# Patient Record
Sex: Female | Born: 1970 | Race: White | Hispanic: No | Marital: Married | Smoking: Never smoker
Health system: Southern US, Community
[De-identification: ages and names within clinical notes are randomized; demographics above are authoritative.]

## PROBLEM LIST (undated history)

## (undated) DIAGNOSIS — E079 Disorder of thyroid, unspecified: Secondary | ICD-10-CM

## (undated) DIAGNOSIS — I10 Essential (primary) hypertension: Secondary | ICD-10-CM

## (undated) DIAGNOSIS — N189 Chronic kidney disease, unspecified: Secondary | ICD-10-CM

## (undated) DIAGNOSIS — E785 Hyperlipidemia, unspecified: Secondary | ICD-10-CM

## (undated) HISTORY — DX: Hyperlipidemia, unspecified: E78.5

## (undated) HISTORY — DX: Essential (primary) hypertension: I10

## (undated) HISTORY — DX: Disorder of thyroid, unspecified: E07.9

## (undated) HISTORY — PX: NO PAST SURGERIES: SHX2092

## (undated) HISTORY — DX: Chronic kidney disease, unspecified: N18.9

---

## 2010-02-10 ENCOUNTER — Encounter: Admission: RE | Admit: 2010-02-10 | Discharge: 2010-02-10 | Payer: Self-pay | Admitting: Family Medicine

## 2011-06-27 DIAGNOSIS — E039 Hypothyroidism, unspecified: Secondary | ICD-10-CM | POA: Insufficient documentation

## 2011-08-08 DIAGNOSIS — F32A Depression, unspecified: Secondary | ICD-10-CM | POA: Insufficient documentation

## 2011-08-08 DIAGNOSIS — K900 Celiac disease: Secondary | ICD-10-CM | POA: Insufficient documentation

## 2011-08-13 ENCOUNTER — Other Ambulatory Visit: Payer: Self-pay | Admitting: Obstetrics and Gynecology

## 2011-08-13 DIAGNOSIS — Z1231 Encounter for screening mammogram for malignant neoplasm of breast: Secondary | ICD-10-CM

## 2011-08-20 ENCOUNTER — Ambulatory Visit
Admission: RE | Admit: 2011-08-20 | Discharge: 2011-08-20 | Disposition: A | Discharge status: Home / Self Care | Payer: Federal, State, Local not specified - PPO | Source: Ambulatory Visit | Attending: Obstetrics and Gynecology | Admitting: Obstetrics and Gynecology

## 2011-08-20 DIAGNOSIS — Z1231 Encounter for screening mammogram for malignant neoplasm of breast: Secondary | ICD-10-CM

## 2011-09-20 ENCOUNTER — Ambulatory Visit: Payer: Self-pay

## 2012-08-28 ENCOUNTER — Other Ambulatory Visit: Payer: Self-pay

## 2012-08-28 DIAGNOSIS — Z1231 Encounter for screening mammogram for malignant neoplasm of breast: Secondary | ICD-10-CM

## 2012-10-02 ENCOUNTER — Ambulatory Visit
Admission: RE | Admit: 2012-10-02 | Discharge: 2012-10-02 | Disposition: A | Discharge status: Home / Self Care | Payer: Federal, State, Local not specified - PPO | Source: Ambulatory Visit

## 2012-10-02 DIAGNOSIS — Z1231 Encounter for screening mammogram for malignant neoplasm of breast: Secondary | ICD-10-CM

## 2013-09-22 ENCOUNTER — Other Ambulatory Visit: Payer: Self-pay

## 2013-09-22 DIAGNOSIS — Z1231 Encounter for screening mammogram for malignant neoplasm of breast: Secondary | ICD-10-CM

## 2013-10-09 ENCOUNTER — Encounter (INDEPENDENT_AMBULATORY_CARE_PROVIDER_SITE_OTHER): Payer: Self-pay

## 2013-10-09 ENCOUNTER — Ambulatory Visit
Admission: RE | Admit: 2013-10-09 | Discharge: 2013-10-09 | Disposition: A | Discharge status: Home / Self Care | Payer: Federal, State, Local not specified - PPO | Source: Ambulatory Visit

## 2013-10-09 DIAGNOSIS — Z1231 Encounter for screening mammogram for malignant neoplasm of breast: Secondary | ICD-10-CM

## 2014-09-22 ENCOUNTER — Other Ambulatory Visit: Payer: Self-pay

## 2014-09-22 DIAGNOSIS — Z1231 Encounter for screening mammogram for malignant neoplasm of breast: Secondary | ICD-10-CM

## 2014-10-29 ENCOUNTER — Ambulatory Visit: Payer: Federal, State, Local not specified - PPO

## 2014-11-17 ENCOUNTER — Ambulatory Visit
Admission: RE | Admit: 2014-11-17 | Discharge: 2014-11-17 | Disposition: A | Discharge status: Home / Self Care | Payer: Federal, State, Local not specified - PPO | Source: Ambulatory Visit

## 2014-11-17 DIAGNOSIS — Z1231 Encounter for screening mammogram for malignant neoplasm of breast: Secondary | ICD-10-CM

## 2015-10-27 ENCOUNTER — Other Ambulatory Visit: Payer: Self-pay

## 2015-10-27 ENCOUNTER — Other Ambulatory Visit: Payer: Self-pay | Admitting: Obstetrics and Gynecology

## 2015-10-27 DIAGNOSIS — Z1231 Encounter for screening mammogram for malignant neoplasm of breast: Secondary | ICD-10-CM

## 2015-11-30 ENCOUNTER — Ambulatory Visit
Admission: RE | Admit: 2015-11-30 | Discharge: 2015-11-30 | Disposition: A | Discharge status: Home / Self Care | Payer: Federal, State, Local not specified - PPO | Source: Ambulatory Visit | Attending: Obstetrics and Gynecology | Admitting: Obstetrics and Gynecology

## 2015-11-30 DIAGNOSIS — Z1231 Encounter for screening mammogram for malignant neoplasm of breast: Secondary | ICD-10-CM

## 2016-10-30 ENCOUNTER — Other Ambulatory Visit: Payer: Self-pay | Admitting: Family Medicine

## 2016-10-30 DIAGNOSIS — Z1231 Encounter for screening mammogram for malignant neoplasm of breast: Secondary | ICD-10-CM

## 2016-12-21 ENCOUNTER — Ambulatory Visit: Payer: Federal, State, Local not specified - PPO

## 2017-01-07 ENCOUNTER — Ambulatory Visit: Payer: Federal, State, Local not specified - PPO

## 2017-04-30 ENCOUNTER — Other Ambulatory Visit: Payer: Self-pay | Admitting: Gastroenterology

## 2017-04-30 DIAGNOSIS — R1011 Right upper quadrant pain: Secondary | ICD-10-CM

## 2017-05-08 ENCOUNTER — Ambulatory Visit (HOSPITAL_COMMUNITY)
Admission: RE | Admit: 2017-05-08 | Discharge: 2017-05-08 | Disposition: A | Discharge status: Home / Self Care | Payer: Federal, State, Local not specified - PPO | Source: Ambulatory Visit | Attending: Gastroenterology | Admitting: Gastroenterology

## 2017-05-08 ENCOUNTER — Encounter (HOSPITAL_COMMUNITY)
Admission: RE | Admit: 2017-05-08 | Discharge: 2017-05-08 | Disposition: A | Discharge status: Home / Self Care | Payer: Federal, State, Local not specified - PPO | Source: Ambulatory Visit | Attending: Gastroenterology | Admitting: Gastroenterology

## 2017-05-08 DIAGNOSIS — R1011 Right upper quadrant pain: Secondary | ICD-10-CM

## 2017-05-08 MED ORDER — TECHNETIUM TC 99M MEBROFENIN IV KIT
5.1000 | PACK | Freq: Once | INTRAVENOUS | Status: AC | PRN
  Administered 2017-05-08: 5.1 via INTRAVENOUS

## 2017-05-16 DIAGNOSIS — Z9049 Acquired absence of other specified parts of digestive tract: Secondary | ICD-10-CM | POA: Insufficient documentation

## 2017-05-16 HISTORY — PX: CHOLECYSTECTOMY: SHX55

## 2017-06-25 ENCOUNTER — Ambulatory Visit
Admission: RE | Admit: 2017-06-25 | Discharge: 2017-06-25 | Disposition: A | Discharge status: Home / Self Care | Payer: Federal, State, Local not specified - PPO | Source: Ambulatory Visit | Attending: Family Medicine | Admitting: Family Medicine

## 2017-06-25 DIAGNOSIS — Z1231 Encounter for screening mammogram for malignant neoplasm of breast: Secondary | ICD-10-CM

## 2018-01-28 DIAGNOSIS — E663 Overweight: Secondary | ICD-10-CM | POA: Insufficient documentation

## 2018-01-28 DIAGNOSIS — K5909 Other constipation: Secondary | ICD-10-CM | POA: Insufficient documentation

## 2018-05-19 ENCOUNTER — Other Ambulatory Visit: Payer: Self-pay | Admitting: Family Medicine

## 2018-05-19 DIAGNOSIS — Z1231 Encounter for screening mammogram for malignant neoplasm of breast: Secondary | ICD-10-CM

## 2018-06-27 ENCOUNTER — Ambulatory Visit
Admission: RE | Admit: 2018-06-27 | Discharge: 2018-06-27 | Disposition: A | Discharge status: Home / Self Care | Payer: Federal, State, Local not specified - PPO | Source: Ambulatory Visit | Attending: Family Medicine | Admitting: Family Medicine

## 2018-06-27 DIAGNOSIS — Z1231 Encounter for screening mammogram for malignant neoplasm of breast: Secondary | ICD-10-CM

## 2018-12-09 DIAGNOSIS — E785 Hyperlipidemia, unspecified: Secondary | ICD-10-CM | POA: Insufficient documentation

## 2018-12-09 DIAGNOSIS — K219 Gastro-esophageal reflux disease without esophagitis: Secondary | ICD-10-CM | POA: Insufficient documentation

## 2018-12-09 DIAGNOSIS — I10 Essential (primary) hypertension: Secondary | ICD-10-CM | POA: Insufficient documentation

## 2019-03-03 DIAGNOSIS — M542 Cervicalgia: Secondary | ICD-10-CM | POA: Insufficient documentation

## 2019-03-03 DIAGNOSIS — R0602 Shortness of breath: Secondary | ICD-10-CM | POA: Insufficient documentation

## 2019-03-03 DIAGNOSIS — H9201 Otalgia, right ear: Secondary | ICD-10-CM | POA: Insufficient documentation

## 2019-03-03 DIAGNOSIS — K219 Gastro-esophageal reflux disease without esophagitis: Secondary | ICD-10-CM | POA: Insufficient documentation

## 2019-04-07 DIAGNOSIS — G9001 Carotid sinus syncope: Secondary | ICD-10-CM | POA: Insufficient documentation

## 2019-04-10 ENCOUNTER — Encounter: Payer: Self-pay | Admitting: Pulmonary Disease

## 2019-04-15 ENCOUNTER — Ambulatory Visit: Payer: Federal, State, Local not specified - PPO | Admitting: Pulmonary Disease

## 2019-04-15 ENCOUNTER — Encounter: Payer: Self-pay | Admitting: Pulmonary Disease

## 2019-04-15 ENCOUNTER — Other Ambulatory Visit: Payer: Self-pay

## 2019-04-15 VITALS — BP 128/88 | HR 85 | Temp 97.3°F | Ht 74.0 in | Wt 228.8 lb

## 2019-04-15 DIAGNOSIS — H5789 Other specified disorders of eye and adnexa: Secondary | ICD-10-CM

## 2019-04-15 DIAGNOSIS — K900 Celiac disease: Secondary | ICD-10-CM

## 2019-04-15 DIAGNOSIS — R1084 Generalized abdominal pain: Secondary | ICD-10-CM | POA: Diagnosis not present

## 2019-04-15 DIAGNOSIS — R0602 Shortness of breath: Secondary | ICD-10-CM

## 2019-04-15 NOTE — Progress Notes (Signed)
Synopsis: Referred in 04/15/2019 for SOB by [name], Michael C, MD  Subjective:   PATIENT ID: Diane Morales GENDER: female DOB: 11/20/1970, MRN: [id number]  Chief Complaint  Patient presents with  . Consult    Patient is having shortness of breath with exertion. Patient expresses this has got worse.    C/o SOB for >1 year. She was seen by PCP for abdominal pains, CT scan of abd was neg. She was referred to GI. Normal eval. Seen by PCP again sent to cardiology, stress ECHO was normal. Patient was concerned she had "wegners" and saw rheumatology and was told everything was normal. She had eye redness. And was started on NSAID. Sept/Aug had infections and saw Dr. Pollyann Kennedyosen for ear pain and drainage/pain. Ears were normal. SOB associated with posterior chest tightness. She does have cough. Productive in the morning . Thought to be reflux related.  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  Occupational exposures include chemical exposure however all of her mixes for chemotherapeutics are completed under a hood with appropriate PPE.  PMH HTN, hypothyroidism, GERD, microscopic hematuria, celiac disease    Past Medical History:  Diagnosis Date  . HTN (hypertension)      Family History  Problem Relation Age of Onset  . Breast cancer Mother [id number]  . Breast cancer Maternal Grandmother 72  . Heart disease Father      Past Surgical History:  Procedure Laterality Date  . NO PAST SURGERIES      Social History   Socioeconomic History  . Marital status: Married    Spouse name: Not on file  . Number of children: Not on file  . Years of education: Not on file  . Highest education level: Not on file  Occupational History  . Not on file  Social Needs  . Financial resource strain: Not on file  . Food insecurity    Worry: Not on file    Inability: Not on file  . Transportation needs    Medical: Not on file    Non-medical: Not on file  Tobacco Use  . Smoking  status: Never Smoker  . Smokeless tobacco: Never Used  Substance and Sexual Activity  . Alcohol use: Not on file  . Drug use: Not on file  . Sexual activity: Not on file  Lifestyle  . Physical activity    Days per week: Not on file    Minutes per session: Not on file  . Stress: Not on file  Relationships  . Social Musicianconnections    Talks on phone: Not on file    Gets together: Not on file    Attends religious service: Not on file    Active member of club or organization: Not on file    Attends meetings of clubs or organizations: Not on file    Relationship status: Not on file  . Intimate partner violence    Fear of current or ex partner: Not on file    Emotionally abused: Not on file    Physically abused: Not on file    Forced sexual activity: Not on file  Other Topics Concern  . Not on file  Social History Narrative  . Not on file     No Known Allergies   Outpatient Medications Prior to Visit  Medication Sig Dispense Refill  . folic acid (FOLVITE) 1 MG tablet folic acid 1 mg tablet    . levothyroxine (SYNTHROID) 175 MCG tablet levothyroxine 175 mcg tablet    .  lisinopril (ZESTRIL) 10 MG tablet lisinopril 10 mg tablet    . pantoprazole (PROTONIX) 20 MG tablet Take by mouth.    . Prucalopride Succinate (MOTEGRITY) 2 MG TABS Motegrity 2 mg tablet     No facility-administered medications prior to visit.     Review of Systems  Constitutional: Negative for chills, fever, malaise/fatigue and weight loss.  HENT: Negative for hearing loss, sore throat and tinnitus.   Eyes: Negative for blurred vision and double vision.  Respiratory: Positive for shortness of breath. Negative for cough, hemoptysis, sputum production, wheezing and stridor.   Cardiovascular: Negative for chest pain, palpitations, orthopnea, leg swelling and PND.  Gastrointestinal: Negative for abdominal pain, constipation, diarrhea, heartburn, nausea and vomiting.  Genitourinary: Negative for dysuria, hematuria  and urgency.  Musculoskeletal: Negative for joint pain and myalgias.  Skin: Negative for itching and rash.  Neurological: Negative for dizziness, tingling, weakness and headaches.  Endo/Heme/Allergies: Negative for environmental allergies. Does not bruise/bleed easily.  Psychiatric/Behavioral: Negative for depression. The patient is not nervous/anxious and does not have insomnia.   All other systems reviewed and are negative.    Objective:  Physical Exam Vitals signs reviewed.  Constitutional:      General: She is not in acute distress.    Appearance: She is well-developed.  HENT:     Head: Normocephalic and atraumatic.  Eyes:     General: No scleral icterus.    Conjunctiva/sclera: Conjunctivae normal.     Pupils: Pupils are equal, round, and reactive to light.  Neck:     Musculoskeletal: Neck supple.     Vascular: No JVD.     Trachea: No tracheal deviation.  Cardiovascular:     Rate and Rhythm: Normal rate and regular rhythm.     Heart sounds: Normal heart sounds. No murmur.  Pulmonary:     Effort: Pulmonary effort is normal. No tachypnea, accessory muscle usage or respiratory distress.     Breath sounds: Normal breath sounds. No stridor. No wheezing, rhonchi or rales.  Abdominal:     General: Bowel sounds are normal. There is no distension.     Palpations: Abdomen is soft.     Tenderness: There is no abdominal tenderness.  Musculoskeletal:        General: No tenderness.  Lymphadenopathy:     Cervical: No cervical adenopathy.  Skin:    General: Skin is warm and dry.     Capillary Refill: Capillary refill takes less than 2 seconds.     Findings: No rash.  Neurological:     Mental Status: She is alert and oriented to person, place, and time.  Psychiatric:        Behavior: Behavior normal.      Vitals:   04/15/19 0917  BP: 128/88  Pulse: 85  Temp: (!) 97.3 F (36.3 C)  TempSrc: Temporal  SpO2: 98%  Weight: 228 lb 12.8 oz (103.8 kg)  Height: 6\' 2"  (1.88 m)    98% on RA BMI Readings from Last 3 Encounters:  04/15/19 29.38 kg/m  05/08/17 25.68 kg/m   Wt Readings from Last 3 Encounters:  04/15/19 228 lb 12.8 oz (103.8 kg)  05/08/17 200 lb (90.7 kg)     CBC No results found for: WBC, RBC, HGB, HCT, PLT, MCV, MCH, MCHC, RDW, LYMPHSABS, MONOABS, EOSABS, BASOSABS   Chest Imaging: Chest x-ray 06/27/2018: Completed at [facility].  Image report in care everywhere states no acute process normal chest x-ray. The patient's images have been independently reviewed by me.  Pulmonary Functions Testing Results: No flowsheet data found.  FeNO: None   Pathology: None   Echocardiogram: None   Heart Catheterization: None     Assessment & Plan:     ICD-10-CM   1. SOB (shortness of breath)  R06.02 FULL + - Pulmonary Function Test (LBPU)  2. Shortness of breath  R06.02 HRCT ILD - CT CHEST HIGH RESOLUTION  3. Eye inflammation  H57.89   4. Abdominal pain, generalized  R10.84   5. Celiac disease  K90.0     Discussion:  This is a 48 year old female that has seen multiple specialists to include cardiology GI and her primary care provider for evaluation of various symptomatology.  At this point she is concerned about shortness of breath.  She has been told this may be related to increased weight gain.  She knows that she is not as athletic as she was in the past.  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  She states she has had cardiac evaluation which was negative.  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  She does have a varying list of complaints however have had rheumatologic work-up that was also negative.  She does have celiac disease.  Her shortness of breath is predominantly associated with exertion.  She has not had pulmonary function tests or any CT imaging of the chest.  Plan: We will obtain a high-resolution CT scan of the chest as well as pulmonary function test.  Pulmonary function test are likely going to be delayed due to Covid in her current scheduling process. We will call with CT scan results once completed. Due to the multiple symptomatology, history of celiac disease HRCT would be best to evaluate for any changes of interstitial lung disease.  Greater than 50% of this patient's 45-minute office visit was spent face-to-face discussing above recommendations and treatment plan.    Current Outpatient Medications:  .  folic acid (FOLVITE) 1 MG tablet, folic acid 1 mg tablet, Disp: , Rfl:  .  levothyroxine (SYNTHROID) 175 MCG tablet, levothyroxine 175 mcg tablet, Disp: , Rfl:  .  lisinopril (ZESTRIL) 10 MG tablet, lisinopril 10 mg tablet, Disp: , Rfl:  .  pantoprazole (PROTONIX) 20 MG tablet, Take by mouth., Disp: , Rfl:  .  Prucalopride Succinate (MOTEGRITY) 2 MG TABS, Motegrity 2 mg tablet, Disp: , Rfl:    Josephine Igo, DO [facility] 04/15/2019 11:16 AM

## 2019-04-15 NOTE — Patient Instructions (Addendum)
Thank you for visiting Dr. Valeta Harms at [facility]. Today we recommend the following:  Orders Placed This Encounter  Procedures  . HRCT ILD - CT CHEST HIGH RESOLUTION  . FULL + 6MWT - Pulmonary Function Test (LBPU)   I will call you with results of CT.  We will scheduled next available.   Return in about 8 weeks (around 06/10/2019).     Please do your part to reduce the spread of COVID-19.

## 2019-04-24 ENCOUNTER — Other Ambulatory Visit (HOSPITAL_COMMUNITY)
Admission: RE | Admit: 2019-04-24 | Discharge: 2019-04-24 | Disposition: A | Discharge status: Home / Self Care | Payer: Federal, State, Local not specified - PPO | Source: Ambulatory Visit | Attending: Pulmonary Disease | Admitting: Pulmonary Disease

## 2019-04-24 DIAGNOSIS — Z20828 Contact with and (suspected) exposure to other viral communicable diseases: Secondary | ICD-10-CM | POA: Insufficient documentation

## 2019-04-24 DIAGNOSIS — Z01812 Encounter for preprocedural laboratory examination: Secondary | ICD-10-CM | POA: Diagnosis present

## 2019-04-24 LAB — SARS CORONAVIRUS 2 (TAT 6-24 HRS): SARS Coronavirus 2: NEGATIVE

## 2019-04-28 ENCOUNTER — Other Ambulatory Visit: Payer: Self-pay

## 2019-04-28 ENCOUNTER — Ambulatory Visit (INDEPENDENT_AMBULATORY_CARE_PROVIDER_SITE_OTHER): Payer: Federal, State, Local not specified - PPO | Admitting: Pulmonary Disease

## 2019-04-28 ENCOUNTER — Other Ambulatory Visit: Payer: Federal, State, Local not specified - PPO

## 2019-04-28 DIAGNOSIS — R0602 Shortness of breath: Secondary | ICD-10-CM | POA: Diagnosis not present

## 2019-04-28 LAB — PULMONARY FUNCTION TEST
DL/VA % pred: 127 %
DL/VA: 5.2 ml/min/mmHg/L
DLCO unc % pred: 114 %
DLCO unc: 32.07 ml/min/mmHg
FEF 25-75 Post: 4.69 L/sec
FEF 25-75 Pre: 4.27 L/sec
FEF2575-%Change-Post: 9 %
FEF2575-%Pred-Post: 137 %
FEF2575-%Pred-Pre: 125 %
FEV1-%Change-Post: 0 %
FEV1-%Pred-Post: 97 %
FEV1-%Pred-Pre: 97 %
FEV1-Post: 3.66 L
FEV1-Pre: 3.63 L
FEV1FVC-%Change-Post: 2 %
FEV1FVC-%Pred-Pre: 104 %
FEV6-%Change-Post: -1 %
FEV6-%Pred-Post: 92 %
FEV6-%Pred-Pre: 93 %
FEV6-Post: 4.24 L
FEV6-Pre: 4.3 L
FEV6FVC-%Pred-Post: 102 %
FEV6FVC-%Pred-Pre: 102 %
FVC-%Change-Post: -1 %
FVC-%Pred-Post: 90 %
FVC-%Pred-Pre: 91 %
FVC-Post: 4.24 L
FVC-Pre: 4.3 L
Post FEV1/FVC ratio: 86 %
Post FEV6/FVC ratio: 100 %
Pre FEV1/FVC ratio: 84 %
Pre FEV6/FVC Ratio: 100 %
RV % pred: 92 %
RV: 1.99 L
TLC % pred: 101 %
TLC: 6.41 L

## 2019-04-28 NOTE — Progress Notes (Signed)
PFT completed today 04/28/19.  

## 2019-04-30 ENCOUNTER — Other Ambulatory Visit: Payer: Self-pay

## 2019-04-30 ENCOUNTER — Ambulatory Visit (INDEPENDENT_AMBULATORY_CARE_PROVIDER_SITE_OTHER)
Admission: RE | Admit: 2019-04-30 | Discharge: 2019-04-30 | Disposition: A | Discharge status: Home / Self Care | Payer: Federal, State, Local not specified - PPO | Source: Ambulatory Visit | Attending: Pulmonary Disease | Admitting: Pulmonary Disease

## 2019-04-30 DIAGNOSIS — R0602 Shortness of breath: Secondary | ICD-10-CM | POA: Diagnosis not present

## 2019-05-05 ENCOUNTER — Telehealth: Payer: Self-pay | Admitting: Pulmonary Disease

## 2019-05-05 NOTE — Telephone Encounter (Signed)
PCCM:  I attempted to call the patient at the number in the chart to discuss her recent ct results and PFTs.   There was no answer on the phone and her voicemail box was empty.   Garner Nash, DO [facility] 05/05/2019 3:03 PM

## 2019-06-10 ENCOUNTER — Other Ambulatory Visit: Payer: Self-pay

## 2019-06-10 ENCOUNTER — Encounter: Payer: Self-pay | Admitting: Primary Care

## 2019-06-10 ENCOUNTER — Ambulatory Visit (INDEPENDENT_AMBULATORY_CARE_PROVIDER_SITE_OTHER): Payer: Federal, State, Local not specified - PPO

## 2019-06-10 ENCOUNTER — Ambulatory Visit: Payer: Federal, State, Local not specified - PPO | Admitting: Primary Care

## 2019-06-10 DIAGNOSIS — J984 Other disorders of lung: Secondary | ICD-10-CM

## 2019-06-10 DIAGNOSIS — R0602 Shortness of breath: Secondary | ICD-10-CM

## 2019-06-10 MED ORDER — ALBUTEROL SULFATE HFA 108 (90 BASE) MCG/ACT IN AERS: 2.0000 | INHALATION_SPRAY | Freq: Four times a day (QID) | RESPIRATORY_TRACT | 1 refills | Status: DC | PRN

## 2019-06-10 NOTE — Progress Notes (Signed)
SIX MIN WALK 06/10/2019  Medications folic acid, protonix, synthroid, motegrity  Supplimental Oxygen during Test? (L/min) No  Laps 18  Partial Lap (in Meters) 16  Baseline BP (sitting) 124/90  Baseline Heartrate 66  Baseline Dyspnea (Borg Scale) 0  Baseline Fatigue (Borg Scale) 0  Baseline SPO2 100  BP (sitting) 132/90  Heartrate 83  Dyspnea (Borg Scale) 3  Fatigue (Borg Scale) 1  SPO2 99  BP (sitting) 128/88  Heartrate 73  SPO2 97  Stopped or Paused before Six Minutes No  Distance Completed 628  Tech Comments: pt only c/o chest tightness

## 2019-06-10 NOTE — Patient Instructions (Addendum)
Spirometry was normal Diffusion capacity was mildly elevated which can be due to asthma, obesity or cardiac   CT chest- no evidence of interstitial lung disease, mild air trapping predominantly in the upper lungs, indicative of mild small airway disease (these are the periphery airways that are <10mm and can be obstructed d/t inflammation)  Recommendations: Trial albuterol 2 puffs every 4-6 hours as needed for shortness of breath/wheezing or chest tightness  Follow-up: 6 months with Dr. Valeta Harms   Asthma, Adult  Asthma is a long-term (chronic) condition in which the airways get tight and narrow. The airways are the breathing passages that lead from the nose and mouth down into the lungs. A person with asthma will have times when symptoms get worse. These are called asthma attacks. They can cause coughing, whistling sounds when you breathe (wheezing), shortness of breath, and chest pain. They can make it hard to breathe. There is no cure for asthma, but medicines and lifestyle changes can help control it. There are many things that can bring on an asthma attack or make asthma symptoms worse (triggers). Common triggers include:  Mold.  Dust.  Cigarette smoke.  Cockroaches.  Things that can cause allergy symptoms (allergens). These include animal skin flakes (dander) and pollen from trees or grass.  Things that pollute the air. These may include household cleaners, wood smoke, smog, or chemical odors.  Cold air, weather changes, and wind.  Crying or laughing hard.  Stress.  Certain medicines or drugs.  Certain foods such as dried fruit, potato chips, and grape juice.  Infections, such as a cold or the flu.  Certain medical conditions or diseases.  Exercise or tiring activities. Asthma may be treated with medicines and by staying away from the things that cause asthma attacks. Types of medicines may include:  Controller medicines. These help prevent asthma symptoms. They are  usually taken every day.  Fast-acting reliever or rescue medicines. These quickly relieve asthma symptoms. They are used as needed and provide short-term relief.  Allergy medicines if your attacks are brought on by allergens.  Medicines to help control the body's defense (immune) system. Follow these instructions at home: Avoiding triggers in your home  Change your heating and air conditioning filter often.  Limit your use of fireplaces and wood stoves.  Get rid of pests (such as roaches and mice) and their droppings.  Throw away plants if you see mold on them.  Clean your floors. Dust regularly. Use cleaning products that do not smell.  Have someone vacuum when you are not home. Use a vacuum cleaner with a HEPA filter if possible.  Replace carpet with wood, tile, or vinyl flooring. Carpet can trap animal skin flakes and dust.  Use allergy-proof pillows, mattress covers, and box spring covers.  Wash bed sheets and blankets every week in hot water. Dry them in a dryer.  Keep your bedroom free of any triggers.  Avoid pets and keep windows closed when things that cause allergy symptoms are in the air.  Use blankets that are made of polyester or cotton.  Clean bathrooms and kitchens with bleach. If possible, have someone repaint the walls in these rooms with mold-resistant paint. Keep out of the rooms that are being cleaned and painted.  Wash your hands often with soap and water. If soap and water are not available, use hand sanitizer.  Do not allow anyone to smoke in your home. General instructions  Take over-the-counter and prescription medicines only as told by your doctor. ?  Talk with your doctor if you have questions about how or when to take your medicines. ? Make note if you need to use your medicines more often than usual.  Do not use any products that contain nicotine or tobacco, such as cigarettes and e-cigarettes. If you need help quitting, ask your doctor.  Stay  away from secondhand smoke.  Avoid doing things outdoors when allergen counts are high and when air quality is low.  Wear a ski mask when doing outdoor activities in the winter. The mask should cover your nose and mouth. Exercise indoors on cold days if you can.  Warm up before you exercise. Take time to cool down after exercise.  Use a peak flow meter as told by your doctor. A peak flow meter is a tool that measures how well the lungs are working.  Keep track of the peak flow meter's readings. Write them down.  Follow your asthma action plan. This is a written plan for taking care of your asthma and treating your attacks.  Make sure you get all the shots (vaccines) that your doctor recommends. Ask your doctor about a flu shot and a pneumonia shot.  Keep all follow-up visits as told by your doctor. This is important. Contact a doctor if:  You have wheezing, shortness of breath, or a cough even while taking medicine to prevent attacks.  The mucus you cough up (sputum) is thicker than usual.  The mucus you cough up changes from clear or white to yellow, green, gray, or bloody.  You have problems from the medicine you are taking, such as: ? A rash. ? Itching. ? Swelling. ? Trouble breathing.  You need reliever medicines more than 2-3 times a week.  Your peak flow reading is still at 50-79% of your personal best after following the action plan for 1 hour.  You have a fever. Get help right away if:  You seem to be worse and are not responding to medicine during an asthma attack.  You are short of breath even at rest.  You get short of breath when doing very little activity.  You have trouble eating, drinking, or talking.  You have chest pain or tightness.  You have a fast heartbeat.  Your lips or fingernails start to turn blue.  You are light-headed or dizzy, or you faint.  Your peak flow is less than 50% of your personal best.  You feel too tired to breathe  normally. Summary  Asthma is a long-term (chronic) condition in which the airways get tight and narrow. An asthma attack can make it hard to breathe.  Asthma cannot be cured, but medicines and lifestyle changes can help control it.  Make sure you understand how to avoid triggers and how and when to use your medicines. This information is not intended to replace advice given to you by your health care provider. Make sure you discuss any questions you have with your health care provider. Document Revised: 07/17/2018 Document Reviewed: 06/18/2016 Elsevier Patient Education  2020 [employer].

## 2019-06-10 NOTE — Assessment & Plan Note (Addendum)
-   No reports of shortness of breath or cough. Occasional "chest tightness" with activity. - Spirometry normal. Mildly elevated DLCO which can be seen in asthma or obesity - HRCT showed no evidence of ILD; mild air trapping predominantly in the upper lungs, indicative of mild small airway disease - Trial PRN albuterol 2 puffs every 4-6 hours for sob/wheezing  - Recommend weight loss and increasing physical activity - Continue to follow with cardiology  - FU in 6 months with Dr. Tonia Brooms

## 2019-06-10 NOTE — Progress Notes (Signed)
@Patient  ID: , female    DOB: 1971-04-09, 48 y.o.   MRN: [id number]  Chief Complaint  Patient presents with  . Follow-up    PFT 04/28/2019,6 min walk today.C/o heartburn occass.,no sob or cough.    Referring provider: 14/05/2018, MD  HPI: 49 year old female, never smoked. PMH significant for HTN, GERD, hypothyroidism, microscopic hematuria, celiac disease. Patient of Dr. 57, seen for new consult for shortness of breath on 04/15/19. She has had a cardiac evaluation which was negative. Ordered for HRCT and PFTs.   06/10/2019 Patient presents today for follow-up with 06/12/2019. She is doing well, denies shortness of breath or cough. Reports occasional chest pain "indigestion" which is  becoming more frequent. Described as a "jolt" that goes away and sometimes it lingers as chest tightness. At first she was experiencing shortness of breath in the morning, she thought this could be due to her ACEi. She now takes lisinopril at night. Reports that she has had a normal stress test with cardiology. She feels a lot of her symptoms can be attributed to weight gain and not being as active as she once was. She has received first COVID vaccine.   Chest Imaging:   HRCT 04/30/19- No evidence of ILD, mild air trapping predominantly in the upper lungs, indicative of mild small airway disease  Chest x-ray 06/27/2018: Completed at [facility].  Image report in care everywhere states no acute process normal chest x-ray.  Pulmonary Functions Testing Results: 04/28/19- FVC 4.24 (90%), FEV1 3.66 (97%), ratio 86 Normal spirometry, DLCO mildly elevated which can be seen in asthma, obesity or cardiac   FeNO: None    No Known Allergies  Immunization History  Administered Date(s) Administered  . Influenza-Unspecified 02/18/2019    Past Medical History:  Diagnosis Date  . HTN (hypertension)     Tobacco History: Social History   Tobacco Use  Smoking Status Never Smoker  Smokeless  Tobacco Never Used   Counseling given: Not Answered   Outpatient Medications Prior to Visit  Medication Sig Dispense Refill  . folic acid (FOLVITE) 1 MG tablet folic acid 1 mg tablet    . levothyroxine (SYNTHROID) 175 MCG tablet levothyroxine 175 mcg tablet    . lisinopril (ZESTRIL) 10 MG tablet lisinopril 10 mg tablet    . meloxicam (MOBIC) 15 MG tablet Take 15 mg by mouth daily.    . pantoprazole (PROTONIX) 20 MG tablet Take by mouth.    . Prucalopride Succinate (MOTEGRITY) 2 MG TABS Motegrity 2 mg tablet     No facility-administered medications prior to visit.   Review of Systems  Review of Systems  Respiratory: Positive for chest tightness. Negative for shortness of breath and wheezing.   Gastrointestinal:       Heartburn/indigestion    Physical Exam  BP 128/76 (BP Location: Right Arm, Cuff Size: Normal)   Pulse 70   Temp 97.7 F (36.5 C) (Temporal)   Ht 6\' 2"  (1.88 m)   Wt 228 lb (103.4 kg)   SpO2 99%   BMI 29.27 kg/m  Physical Exam Constitutional:      General: She is not in acute distress.    Appearance: Normal appearance. She is not ill-appearing.  HENT:     Head: Normocephalic and atraumatic.     Mouth/Throat:     Comments: Deferred d/t masking Cardiovascular:     Rate and Rhythm: Normal rate and regular rhythm.     Heart sounds: No murmur.  Pulmonary:  Effort: Pulmonary effort is normal. No respiratory distress.     Breath sounds: Normal breath sounds. No stridor. No wheezing.     Comments: Diminished at bases Musculoskeletal:     Cervical back: Normal range of motion and neck supple.  Skin:    General: Skin is warm and dry.  Neurological:     General: No focal deficit present.     Mental Status: She is alert and oriented to person, place, and time. Mental status is at baseline.  Psychiatric:        Mood and Affect: Mood normal.        Behavior: Behavior normal.        Thought Content: Thought content normal.        Judgment: Judgment normal.        Lab Results:  CBC No results found for: WBC, RBC, HGB, HCT, PLT, MCV, MCH, MCHC, RDW, LYMPHSABS, MONOABS, EOSABS, BASOSABS  BMET No results found for: NA, K, CL, CO2, GLUCOSE, BUN, CREATININE, CALCIUM, GFRNONAA, GFRAA  BNP No results found for: BNP  ProBNP No results found for: PROBNP  Imaging: No results found.   Assessment & Plan:   Small airways disease - No reports of shortness of breath or cough. Occasional "chest tightness" with activity. - Spirometry normal. Mildly elevated DLCO which can be seen in asthma or obesity - HRCT showed no evidence of ILD; mild air trapping predominantly in the upper lungs, indicative of mild small airway disease - Trial PRN albuterol 2 puffs every 4-6 hours for sob/wheezing  - Recommend weight loss and increasing physical activity - Continue to follow with cardiology  - FU in 6 months with Dr. Marthe Patch, NP 06/10/2019

## 2019-06-30 ENCOUNTER — Other Ambulatory Visit: Payer: Self-pay | Admitting: Family Medicine

## 2019-06-30 DIAGNOSIS — Z1231 Encounter for screening mammogram for malignant neoplasm of breast: Secondary | ICD-10-CM

## 2019-08-04 ENCOUNTER — Ambulatory Visit: Payer: Federal, State, Local not specified - PPO

## 2019-08-17 ENCOUNTER — Other Ambulatory Visit: Payer: Self-pay

## 2019-08-17 ENCOUNTER — Ambulatory Visit
Admission: RE | Admit: 2019-08-17 | Discharge: 2019-08-17 | Disposition: A | Discharge status: Home / Self Care | Payer: Federal, State, Local not specified - PPO | Source: Ambulatory Visit | Attending: Family Medicine | Admitting: Family Medicine

## 2019-08-17 DIAGNOSIS — Z1231 Encounter for screening mammogram for malignant neoplasm of breast: Secondary | ICD-10-CM

## 2020-04-12 DIAGNOSIS — K644 Residual hemorrhoidal skin tags: Secondary | ICD-10-CM | POA: Diagnosis not present

## 2020-04-12 DIAGNOSIS — K219 Gastro-esophageal reflux disease without esophagitis: Secondary | ICD-10-CM | POA: Diagnosis not present

## 2020-04-12 DIAGNOSIS — R635 Abnormal weight gain: Secondary | ICD-10-CM | POA: Diagnosis not present

## 2020-04-12 DIAGNOSIS — K5904 Chronic idiopathic constipation: Secondary | ICD-10-CM | POA: Diagnosis not present

## 2020-04-20 DIAGNOSIS — E039 Hypothyroidism, unspecified: Secondary | ICD-10-CM | POA: Diagnosis not present

## 2020-04-20 DIAGNOSIS — E785 Hyperlipidemia, unspecified: Secondary | ICD-10-CM | POA: Diagnosis not present

## 2020-05-16 DIAGNOSIS — I10 Essential (primary) hypertension: Secondary | ICD-10-CM | POA: Diagnosis not present

## 2020-05-16 DIAGNOSIS — E039 Hypothyroidism, unspecified: Secondary | ICD-10-CM | POA: Diagnosis not present

## 2020-06-16 DIAGNOSIS — I10 Essential (primary) hypertension: Secondary | ICD-10-CM | POA: Diagnosis not present

## 2020-06-21 DIAGNOSIS — Z1152 Encounter for screening for COVID-19: Secondary | ICD-10-CM | POA: Diagnosis not present

## 2020-06-28 DIAGNOSIS — L814 Other melanin hyperpigmentation: Secondary | ICD-10-CM | POA: Diagnosis not present

## 2020-06-28 DIAGNOSIS — L578 Other skin changes due to chronic exposure to nonionizing radiation: Secondary | ICD-10-CM | POA: Diagnosis not present

## 2020-06-28 DIAGNOSIS — L719 Rosacea, unspecified: Secondary | ICD-10-CM | POA: Diagnosis not present

## 2020-06-28 DIAGNOSIS — L821 Other seborrheic keratosis: Secondary | ICD-10-CM | POA: Diagnosis not present

## 2020-08-09 DIAGNOSIS — K219 Gastro-esophageal reflux disease without esophagitis: Secondary | ICD-10-CM | POA: Diagnosis not present

## 2020-08-09 DIAGNOSIS — K5904 Chronic idiopathic constipation: Secondary | ICD-10-CM | POA: Diagnosis not present

## 2020-08-09 DIAGNOSIS — K900 Celiac disease: Secondary | ICD-10-CM | POA: Diagnosis not present

## 2020-08-09 DIAGNOSIS — Z1211 Encounter for screening for malignant neoplasm of colon: Secondary | ICD-10-CM | POA: Diagnosis not present

## 2020-08-16 DIAGNOSIS — M722 Plantar fascial fibromatosis: Secondary | ICD-10-CM | POA: Diagnosis not present

## 2020-08-16 DIAGNOSIS — H15102 Unspecified episcleritis, left eye: Secondary | ICD-10-CM | POA: Diagnosis not present

## 2020-08-16 DIAGNOSIS — R0602 Shortness of breath: Secondary | ICD-10-CM | POA: Diagnosis not present

## 2020-08-16 DIAGNOSIS — R079 Chest pain, unspecified: Secondary | ICD-10-CM | POA: Diagnosis not present

## 2020-08-16 DIAGNOSIS — M940 Chondrocostal junction syndrome [Tietze]: Secondary | ICD-10-CM | POA: Diagnosis not present

## 2020-08-16 DIAGNOSIS — R1011 Right upper quadrant pain: Secondary | ICD-10-CM | POA: Diagnosis not present

## 2020-08-16 DIAGNOSIS — R5383 Other fatigue: Secondary | ICD-10-CM | POA: Diagnosis not present

## 2020-08-17 ENCOUNTER — Other Ambulatory Visit: Payer: Self-pay | Admitting: Family Medicine

## 2020-08-17 DIAGNOSIS — Z1231 Encounter for screening mammogram for malignant neoplasm of breast: Secondary | ICD-10-CM

## 2020-10-13 ENCOUNTER — Other Ambulatory Visit: Payer: Self-pay

## 2020-10-13 ENCOUNTER — Ambulatory Visit
Admission: RE | Admit: 2020-10-13 | Discharge: 2020-10-13 | Disposition: A | Discharge status: Home / Self Care | Payer: Federal, State, Local not specified - PPO | Source: Ambulatory Visit | Attending: Family Medicine | Admitting: Family Medicine

## 2020-10-13 ENCOUNTER — Ambulatory Visit: Payer: Federal, State, Local not specified - PPO

## 2020-10-13 DIAGNOSIS — Z1231 Encounter for screening mammogram for malignant neoplasm of breast: Secondary | ICD-10-CM | POA: Diagnosis not present

## 2020-10-13 DIAGNOSIS — Z01419 Encounter for gynecological examination (general) (routine) without abnormal findings: Secondary | ICD-10-CM | POA: Diagnosis not present

## 2020-10-19 DIAGNOSIS — R079 Chest pain, unspecified: Secondary | ICD-10-CM | POA: Diagnosis not present

## 2020-10-19 DIAGNOSIS — M722 Plantar fascial fibromatosis: Secondary | ICD-10-CM | POA: Diagnosis not present

## 2020-10-19 DIAGNOSIS — M940 Chondrocostal junction syndrome [Tietze]: Secondary | ICD-10-CM | POA: Diagnosis not present

## 2020-10-27 DIAGNOSIS — R2689 Other abnormalities of gait and mobility: Secondary | ICD-10-CM | POA: Diagnosis not present

## 2020-10-27 DIAGNOSIS — M25571 Pain in right ankle and joints of right foot: Secondary | ICD-10-CM | POA: Diagnosis not present

## 2020-10-31 DIAGNOSIS — M25571 Pain in right ankle and joints of right foot: Secondary | ICD-10-CM | POA: Diagnosis not present

## 2020-10-31 DIAGNOSIS — R2689 Other abnormalities of gait and mobility: Secondary | ICD-10-CM | POA: Diagnosis not present

## 2020-11-03 DIAGNOSIS — N939 Abnormal uterine and vaginal bleeding, unspecified: Secondary | ICD-10-CM | POA: Diagnosis not present

## 2020-11-07 DIAGNOSIS — M25571 Pain in right ankle and joints of right foot: Secondary | ICD-10-CM | POA: Diagnosis not present

## 2020-11-07 DIAGNOSIS — R2689 Other abnormalities of gait and mobility: Secondary | ICD-10-CM | POA: Diagnosis not present

## 2020-11-10 DIAGNOSIS — R2689 Other abnormalities of gait and mobility: Secondary | ICD-10-CM | POA: Diagnosis not present

## 2020-11-10 DIAGNOSIS — M25571 Pain in right ankle and joints of right foot: Secondary | ICD-10-CM | POA: Diagnosis not present

## 2020-11-11 DIAGNOSIS — H0102B Squamous blepharitis left eye, upper and lower eyelids: Secondary | ICD-10-CM | POA: Diagnosis not present

## 2020-11-11 DIAGNOSIS — H15113 Episcleritis periodica fugax, bilateral: Secondary | ICD-10-CM | POA: Diagnosis not present

## 2020-11-11 DIAGNOSIS — H0102A Squamous blepharitis right eye, upper and lower eyelids: Secondary | ICD-10-CM | POA: Diagnosis not present

## 2020-11-16 DIAGNOSIS — M25571 Pain in right ankle and joints of right foot: Secondary | ICD-10-CM | POA: Diagnosis not present

## 2020-11-16 DIAGNOSIS — R2689 Other abnormalities of gait and mobility: Secondary | ICD-10-CM | POA: Diagnosis not present

## 2020-11-21 DIAGNOSIS — R2689 Other abnormalities of gait and mobility: Secondary | ICD-10-CM | POA: Diagnosis not present

## 2020-11-21 DIAGNOSIS — M25571 Pain in right ankle and joints of right foot: Secondary | ICD-10-CM | POA: Diagnosis not present

## 2020-11-24 DIAGNOSIS — M25571 Pain in right ankle and joints of right foot: Secondary | ICD-10-CM | POA: Diagnosis not present

## 2020-11-24 DIAGNOSIS — R2689 Other abnormalities of gait and mobility: Secondary | ICD-10-CM | POA: Diagnosis not present

## 2020-12-12 DIAGNOSIS — R2689 Other abnormalities of gait and mobility: Secondary | ICD-10-CM | POA: Diagnosis not present

## 2020-12-12 DIAGNOSIS — M25571 Pain in right ankle and joints of right foot: Secondary | ICD-10-CM | POA: Diagnosis not present

## 2020-12-26 DIAGNOSIS — R2689 Other abnormalities of gait and mobility: Secondary | ICD-10-CM | POA: Diagnosis not present

## 2020-12-26 DIAGNOSIS — M25571 Pain in right ankle and joints of right foot: Secondary | ICD-10-CM | POA: Diagnosis not present

## 2020-12-27 DIAGNOSIS — R079 Chest pain, unspecified: Secondary | ICD-10-CM | POA: Diagnosis not present

## 2020-12-27 DIAGNOSIS — M940 Chondrocostal junction syndrome [Tietze]: Secondary | ICD-10-CM | POA: Diagnosis not present

## 2020-12-27 DIAGNOSIS — H15102 Unspecified episcleritis, left eye: Secondary | ICD-10-CM | POA: Diagnosis not present

## 2020-12-27 DIAGNOSIS — R1013 Epigastric pain: Secondary | ICD-10-CM | POA: Diagnosis not present

## 2020-12-28 DIAGNOSIS — Z0000 Encounter for general adult medical examination without abnormal findings: Secondary | ICD-10-CM | POA: Diagnosis not present

## 2020-12-28 DIAGNOSIS — I10 Essential (primary) hypertension: Secondary | ICD-10-CM | POA: Diagnosis not present

## 2020-12-28 DIAGNOSIS — E039 Hypothyroidism, unspecified: Secondary | ICD-10-CM | POA: Diagnosis not present

## 2020-12-28 DIAGNOSIS — E785 Hyperlipidemia, unspecified: Secondary | ICD-10-CM | POA: Diagnosis not present

## 2021-01-02 ENCOUNTER — Emergency Department (HOSPITAL_COMMUNITY)
Admission: EM | Admit: 2021-01-02 | Discharge: 2021-01-02 | Disposition: A | Discharge status: Home / Self Care | Payer: Federal, State, Local not specified - PPO | Attending: Student | Admitting: Student

## 2021-01-02 ENCOUNTER — Emergency Department (HOSPITAL_COMMUNITY): Payer: Federal, State, Local not specified - PPO

## 2021-01-02 ENCOUNTER — Other Ambulatory Visit: Payer: Self-pay

## 2021-01-02 DIAGNOSIS — R079 Chest pain, unspecified: Secondary | ICD-10-CM | POA: Diagnosis not present

## 2021-01-02 DIAGNOSIS — Z79899 Other long term (current) drug therapy: Secondary | ICD-10-CM | POA: Diagnosis not present

## 2021-01-02 DIAGNOSIS — R0602 Shortness of breath: Secondary | ICD-10-CM | POA: Diagnosis not present

## 2021-01-02 DIAGNOSIS — R0600 Dyspnea, unspecified: Secondary | ICD-10-CM | POA: Diagnosis not present

## 2021-01-02 DIAGNOSIS — I10 Essential (primary) hypertension: Secondary | ICD-10-CM | POA: Insufficient documentation

## 2021-01-02 DIAGNOSIS — L719 Rosacea, unspecified: Secondary | ICD-10-CM | POA: Insufficient documentation

## 2021-01-02 DIAGNOSIS — R0902 Hypoxemia: Secondary | ICD-10-CM | POA: Diagnosis not present

## 2021-01-02 DIAGNOSIS — R0789 Other chest pain: Secondary | ICD-10-CM | POA: Diagnosis not present

## 2021-01-02 DIAGNOSIS — R0781 Pleurodynia: Secondary | ICD-10-CM | POA: Diagnosis not present

## 2021-01-02 DIAGNOSIS — J9811 Atelectasis: Secondary | ICD-10-CM | POA: Diagnosis not present

## 2021-01-02 DIAGNOSIS — M940 Chondrocostal junction syndrome [Tietze]: Secondary | ICD-10-CM

## 2021-01-02 LAB — T4, FREE: Free T4: 1.04 ng/dL (ref 0.61–1.12)

## 2021-01-02 LAB — CBC
HCT: 37 % (ref 36.0–46.0)
Hemoglobin: 12.3 g/dL (ref 12.0–15.0)
MCH: 27.7 pg (ref 26.0–34.0)
MCHC: 33.2 g/dL (ref 30.0–36.0)
MCV: 83.3 fL (ref 80.0–100.0)
Platelets: 387 10*3/uL (ref 150–400)
RBC: 4.44 MIL/uL (ref 3.87–5.11)
RDW: 14.6 % (ref 11.5–15.5)
WBC: 12.3 10*3/uL — ABNORMAL HIGH (ref 4.0–10.5)
nRBC: 0 % (ref 0.0–0.2)

## 2021-01-02 LAB — BASIC METABOLIC PANEL
Anion gap: 9 (ref 5–15)
BUN: 29 mg/dL — ABNORMAL HIGH (ref 6–20)
CO2: 26 mmol/L (ref 22–32)
Calcium: 9 mg/dL (ref 8.9–10.3)
Chloride: 100 mmol/L (ref 98–111)
Creatinine, Ser: 1.4 mg/dL — ABNORMAL HIGH (ref 0.44–1.00)
GFR, Estimated: 46 mL/min — ABNORMAL LOW (ref >60)
Glucose, Bld: 130 mg/dL — ABNORMAL HIGH (ref 70–99)
Potassium: 3.3 mmol/L — ABNORMAL LOW (ref 3.5–5.1)
Sodium: 135 mmol/L (ref 135–145)

## 2021-01-02 LAB — TSH: TSH: 45.647 u[IU]/mL — ABNORMAL HIGH (ref 0.350–4.500)

## 2021-01-02 LAB — D-DIMER, QUANTITATIVE: D-Dimer, Quant: 2.02 ug/mL-FEU — ABNORMAL HIGH (ref 0.00–0.50)

## 2021-01-02 LAB — TROPONIN I (HIGH SENSITIVITY)
Troponin I (High Sensitivity): 4 ng/L (ref <18)
Troponin I (High Sensitivity): 4 ng/L (ref <18)

## 2021-01-02 MED ORDER — LACTATED RINGERS IV BOLUS
1000.0000 mL | Freq: Once | INTRAVENOUS | Status: AC
  Administered 2021-01-02: 1000 mL via INTRAVENOUS

## 2021-01-02 MED ORDER — LIDOCAINE 5 % EX PTCH
1.0000 | MEDICATED_PATCH | CUTANEOUS | Status: DC
  Administered 2021-01-02: 1 via TRANSDERMAL
  Filled 2021-01-02: qty 1

## 2021-01-02 MED ORDER — IOHEXOL 350 MG/ML SOLN
100.0000 mL | Freq: Once | INTRAVENOUS | Status: AC | PRN
  Administered 2021-01-02: 100 mL via INTRAVENOUS

## 2021-01-02 MED ORDER — POTASSIUM CHLORIDE CRYS ER 20 MEQ PO TBCR
20.0000 meq | EXTENDED_RELEASE_TABLET | Freq: Once | ORAL | Status: AC
  Administered 2021-01-02: 20 meq via ORAL
  Filled 2021-01-02: qty 1

## 2021-01-02 NOTE — ED Notes (Signed)
Pt to ct 

## 2021-01-02 NOTE — ED Triage Notes (Signed)
Pt BIB EMS from home. Per EMS pt woke up with severe CP radiating to her stomach- Pt reports that she is unable to take a deep breathe without having a lot of pain. Pt started a new medication to help with  her cycle, loloestrin, and a side effect are clots. Initial O2 on RA was 89% - started on 10 L and O2 improved to 97% BP initial 210/92 - EMS gave 2 nitro and 6 of morphine - last BP 160/90 22 LAC

## 2021-01-02 NOTE — ED Provider Notes (Signed)
[facility] EMERGENCY DEPARTMENT Provider Note   CSN: [id number] Arrival date & time: 01/02/21  0221     History Chief Complaint  Patient presents with   Chest Pain   Shortness of Breath    Diane Morales is a 50 y.o. female with PMH HTN, abnormal uterine bleeding currently on OCPs who presents to the emergency department for evaluation of pleuritic chest pain and shortness of breath.  Patient states that she awoke at approximately 1 AM on 01/02/2021 with severe onset left-sided chest pain worse with deep breaths under the left breast.  EMS found the patient to be 89% on room air and was placed on 10 L and brought to the emergency department.  Patient was given 2 nitro and 6 mg of morphine by EMS.  On initial evaluation in the emergency department, patient is saturating 95% on room air but has persistent pleuritic chest pain.  Denies nausea, vomiting, abdominal pain, headache, fever or other systemic symptoms.   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     Family History  Problem Relation Age of Onset   Breast cancer Mother 52   Breast cancer Maternal Grandmother 74   Heart disease Father     Social History   Tobacco Use   Smoking status: Never   Smokeless tobacco: Never    Home Medications Prior to Admission medications   Medication Sig Start Date End Date Taking? Authorizing  Provider  albuterol (VENTOLIN HFA) 108 (90 Base) MCG/ACT inhaler Inhale 2 puffs into the lungs every 6 (six) hours as needed for wheezing or shortness of breath. 06/10/19   Glenford Bayley, NP  folic acid (FOLVITE) 1 MG tablet folic acid 1 mg tablet 02/27/19   [provider]  levothyroxine (SYNTHROID) 175 MCG tablet levothyroxine 175 mcg tablet 12/15/18   [provider]  lisinopril (ZESTRIL) 10 MG tablet lisinopril 10 mg tablet 02/04/19   [provider]  meloxicam (MOBIC) 15 MG tablet Take 15 mg by mouth daily.    [provider]  pantoprazole (PROTONIX) 20 MG tablet Take by mouth.    [provider]  Prucalopride Succinate (MOTEGRITY) 2 MG TABS Motegrity 2 mg tablet 05/01/18   [provider]    Allergies    Patient has no known allergies.  Review of Systems   Review of Systems  Constitutional:  Negative for chills and fever.  HENT:  Negative for ear pain and sore throat.   Eyes:  Negative for pain and visual disturbance.  Respiratory:  Positive for shortness of breath. Negative for cough.   Cardiovascular:  Positive for chest pain. Negative for palpitations.  Gastrointestinal:  Negative for abdominal pain and vomiting.  Genitourinary:  Negative for dysuria and hematuria.  Musculoskeletal:  Negative for arthralgias and back pain.  Skin:  Negative for  color change and rash.  Neurological:  Negative for seizures and syncope.  All other systems reviewed and are negative.  Physical Exam Updated Vital Signs BP (!) 159/86   Pulse 83   Temp 99.7 F (37.6 C) (Axillary)   Resp 18   Ht 6\' 2"  (1.88 m)   Wt 112.5 kg   SpO2 95%   BMI 31.84 kg/m   Physical Exam Vitals and nursing note reviewed.  Constitutional:      General: She is not in acute distress.    Appearance: She is well-developed.  HENT:     Head: Normocephalic and atraumatic.  Eyes:     Conjunctiva/sclera: Conjunctivae normal.  Cardiovascular:     Rate and Rhythm:  Normal rate and regular rhythm.     Heart sounds: No murmur heard. Pulmonary:     Effort: Pulmonary effort is normal. No respiratory distress.     Breath sounds: Normal breath sounds.  Abdominal:     Palpations: Abdomen is soft.     Tenderness: There is no abdominal tenderness.  Musculoskeletal:     Cervical back: Neck supple.  Skin:    General: Skin is warm and dry.  Neurological:     Mental Status: She is alert.    ED Results / Procedures / Treatments   Labs (all labs ordered are listed, but only abnormal results are displayed) Labs Reviewed  BASIC METABOLIC PANEL - Abnormal; Notable for the following components:      Result Value   Potassium 3.3 (*)    Glucose, Bld 130 (*)    BUN 29 (*)    Creatinine, Ser 1.40 (*)    GFR, Estimated 46 (*)    All other components within normal limits  CBC - Abnormal; Notable for the following components:   WBC 12.3 (*)    All other components within normal limits  D-DIMER, QUANTITATIVE - Abnormal; Notable for the following components:   D-Dimer, Quant 2.02 (*)    All other components within normal limits  TSH  T4, FREE  TROPONIN I (HIGH SENSITIVITY)  TROPONIN I (HIGH SENSITIVITY)    EKG None  Radiology DG Chest Port 1 View  Result Date: 01/02/2021 CLINICAL DATA:  Dyspnea EXAM: PORTABLE CHEST 1 VIEW COMPARISON:  CT 04/30/2019 FINDINGS: The heart size and mediastinal contours are within normal limits. Both lungs are clear. The visualized skeletal structures are unremarkable. IMPRESSION: No active disease. Electronically Signed   By: 14/07/2018 M.D.   On: 01/02/2021 03:06    Procedures Procedures   Medications Ordered in ED Medications  lidocaine (LIDODERM) 5 % 1 patch (1 patch Transdermal Patch Applied 01/02/21 0418)  lactated ringers bolus 1,000 mL (1,000 mLs Intravenous New Bag/Given 01/02/21 0545)    ED Course  I have reviewed the triage vital signs and the nursing notes.  Pertinent labs & imaging results that were  available during my care of the patient were reviewed by me and considered in my medical decision making (see chart for details).    MDM Rules/Calculators/A&P                           Patient seen in the emergency department for evaluation of chest pain and shortness of breath.  Physical exam reveals a malar rash to the face that the patient states is consistent with her rosacea, cardiopulmonary exam otherwise unremarkable.  Abdominal exam unremarkable.  Laboratory evaluation reveals a mild leukocytosis to 12.3, hypokalemia to 3.3,  BUN 29, creatinine 1.4.  D-dimer elevated to 2.02.  Chest x-ray unremarkable.  CT PE is currently pending.  Patient requesting TSH and free T4 as she recently doubled her Synthroid dosing at the request of her primary care physician.  Patient then signed out to oncoming provider.  Please see provider signout note for continuation of work-up. Final Clinical Impression(s) / ED Diagnoses Final diagnoses:  None    Rx / DC Orders ED Discharge Orders     None        Mazzy Santarelli, Wyn Forster, MD 01/02/21 [phone]

## 2021-01-02 NOTE — ED Notes (Signed)
Unable to obtain appropriate IV for CT angio; IV team consult placed

## 2021-01-02 NOTE — ED Provider Notes (Signed)
Physical Exam  BP (!) 162/88   Pulse 82   Temp 99.7 F (37.6 C) (Axillary)   Resp 18   Ht 6\' 2"  (1.88 m)   Wt 112.5 kg   SpO2 94%   BMI 31.84 kg/m   Physical Exam Vitals and nursing note reviewed.  Constitutional:      Appearance: She is well-developed.    HENT:     Head: Normocephalic and atraumatic.  Cardiovascular:     Rate and Rhythm: Normal rate and regular rhythm.     Heart sounds: Normal heart sounds.  Pulmonary:     Effort: Pulmonary effort is normal.     Breath sounds: Normal breath sounds. No decreased breath sounds, wheezing, rhonchi or rales.  Musculoskeletal:     Right lower leg: No edema.     Left lower leg: No edema.  Skin:    Capillary Refill: Capillary refill takes less than 2 seconds.  Neurological:     General: No focal deficit present.     Mental Status: She is alert.  Psychiatric:        Mood and Affect: Mood normal.        Behavior: Behavior normal.    ED Course/Procedures     Procedures  Results for orders placed or performed during the hospital encounter of 01/02/21  Basic metabolic panel  Result Value Ref Range   Sodium 135 135 - 145 mmol/L   Potassium 3.3 (L) 3.5 - 5.1 mmol/L   Chloride 100 98 - 111 mmol/L   CO2 26 22 - 32 mmol/L   Glucose, Bld 130 (H) 70 - 99 mg/dL   BUN 29 (H) 6 - 20 mg/dL   Creatinine, Ser 03/04/21 (H) 0.44 - 1.00 mg/dL   Calcium 9.0 8.9 - 6.23 mg/dL   GFR, Estimated 46 (L) >60 mL/min   Anion gap 9 5 - 15  CBC  Result Value Ref Range   WBC 12.3 (H) 4.0 - 10.5 K/uL   RBC 4.44 3.87 - 5.11 MIL/uL   Hemoglobin 12.3 12.0 - 15.0 g/dL   HCT 76.2 83.1 - 51.7 %   MCV 83.3 80.0 - 100.0 fL   MCH 27.7 26.0 - 34.0 pg   MCHC 33.2 30.0 - 36.0 g/dL   RDW 61.6 07.3 - 71.0 %   Platelets 387 150 - 400 K/uL   nRBC 0.0 0.0 - 0.2 %  D-dimer, quantitative  Result Value Ref Range   D-Dimer, Quant 2.02 (H) 0.00 - 0.50 ug/mL-FEU  TSH  Result Value Ref Range   TSH 45.647 (H) 0.350 - 4.500 uIU/mL  T4, free  Result Value Ref  Range   Free T4 1.04 0.61 - 1.12 ng/dL  Troponin I (High Sensitivity)  Result Value Ref Range   Troponin I (High Sensitivity) 4 <18 ng/L  Troponin I (High Sensitivity)  Result Value Ref Range   Troponin I (High Sensitivity) 4 <18 ng/L   CT Angio Chest PE W and/or Wo Contrast  Result Date: 01/02/2021 CLINICAL DATA:  Pleuritic chest pain.  Elevated D-dimer level. EXAM: CT ANGIOGRAPHY CHEST WITH CONTRAST TECHNIQUE: Multidetector CT imaging of the chest was performed using the standard protocol during bolus administration of intravenous contrast. Multiplanar CT image reconstructions and MIPs were obtained to evaluate the vascular anatomy. CONTRAST:  03/04/2021 OMNIPAQUE IOHEXOL 350 MG/ML SOLN COMPARISON:  Chest CT 04/30/2019 FINDINGS: Cardiovascular: Accounting for motion artifact, no filling defect is identified in the pulmonary arterial tree to suggest pulmonary embolus. No acute vascular findings. Mediastinum/Nodes:  Unremarkable Lungs/Pleura: Symmetric dependent atelectasis in both lower lobes. Mosaic attenuation is noted at the lung apices with some reduced conspicuity of vascular structures in the darker segments suggesting air trapping/small airways disease. Upper Abdomen: Cholecystectomy. Musculoskeletal: Unremarkable Review of the MIP images confirms the above findings. IMPRESSION: 1. No filling defect is identified in the pulmonary arterial tree to suggest pulmonary embolus. 2. Symmetric dependent atelectasis in both lower lobes the. 3. Air trapping in the lung apices suggesting small airways disease/bronchiolitis. Electronically Signed   By: Gaylyn Rong M.D.   On: 01/02/2021 07:00   DG Chest Port 1 View  Result Date: 01/02/2021 CLINICAL DATA:  Dyspnea EXAM: PORTABLE CHEST 1 VIEW COMPARISON:  CT 04/30/2019 FINDINGS: The heart size and mediastinal contours are within normal limits. Both lungs are clear. The visualized skeletal structures are unremarkable. IMPRESSION: No active disease.  Electronically Signed   By: Jasmine Pang M.D.   On: 01/02/2021 03:06       MDM   6:15 AM Patient signed out to me by oncoming physician. 50-year-old female presenting for complaints of acute onset shortness of breath and chest pain that awoke her from breathing.  EKG and troponins-doubt ACS. Stable chest x-ray.  No pneumothorax.  No pneumonia.  Potassium minimally low.  Imdur given.  CT PE demonstrates no pulmonary embolism mild atelectasis.  Ambulatory pulse ox stable.  Heart score 1-low risk. Patient admits to working in garage all day yesterday. Pleuritic chest pain possibly secondary to costochondritis. Lidoderm pain patch already given. Motrin/tylenol recommended for home use.  With shared patient decision making, recommendations for discharge at this time with return precautions if symptoms worsen in any way.  Patient's husband at bedside states he will be with her over the next 24 hours and will keep a close eye on her.  Recommendations to follow-up with cardiology in the next 1 to 2 weeks if symptoms do not resolve for stress test. All questions answered prior to discharged.        Edwin Dada P, DO 01/02/21 [phone]

## 2021-01-02 NOTE — ED Notes (Signed)
Pt has 22 in LAC from EMS. This RN and [place] RN attempted to obtain IV access for CTA - unsuccessful. IV team consult to be put in

## 2021-01-04 DIAGNOSIS — R2689 Other abnormalities of gait and mobility: Secondary | ICD-10-CM | POA: Diagnosis not present

## 2021-01-04 DIAGNOSIS — M25571 Pain in right ankle and joints of right foot: Secondary | ICD-10-CM | POA: Diagnosis not present

## 2021-01-13 DIAGNOSIS — Z1211 Encounter for screening for malignant neoplasm of colon: Secondary | ICD-10-CM | POA: Diagnosis not present

## 2021-01-17 DIAGNOSIS — M25571 Pain in right ankle and joints of right foot: Secondary | ICD-10-CM | POA: Diagnosis not present

## 2021-01-17 DIAGNOSIS — R2689 Other abnormalities of gait and mobility: Secondary | ICD-10-CM | POA: Diagnosis not present

## 2021-02-10 DIAGNOSIS — E785 Hyperlipidemia, unspecified: Secondary | ICD-10-CM | POA: Diagnosis not present

## 2021-02-10 DIAGNOSIS — E039 Hypothyroidism, unspecified: Secondary | ICD-10-CM | POA: Diagnosis not present

## 2021-02-28 DIAGNOSIS — Z23 Encounter for immunization: Secondary | ICD-10-CM | POA: Diagnosis not present

## 2021-02-28 DIAGNOSIS — L719 Rosacea, unspecified: Secondary | ICD-10-CM | POA: Diagnosis not present

## 2021-04-03 NOTE — Progress Notes (Signed)
Cardiology Office Note:    Date:  04/17/2021   ID:  Diane Morales, DOB [birth year], MRN [id number]  PCP:  Eartha Inch, MD   [facility] Providers Cardiologist:  None {    Referring MD: Eartha Inch, MD     History of Present Illness:    Diane Morales is a 50 y.o. female with a hx of HTN who was referred by Dr. Cyndia Bent for further evaluation of chest pain and HTN.  Patient was seen at [facility] ER on 01/02/21 for chest pain and SOB that awoke her from sleep. Work-up in the ER reassuring with normal troponins x2, normal ECG, CTA chest without evidence of PE or significant coronary calcium. Symptoms thought to be secondary to suspected costochondritis and patient was discharged home with plans for Cardiology follow-up.  Today, the patient states she had been suffering with high blood pressure over the past couple of years. Was initially placed on lisinopril, which was stopped due to highK and rising Cr or amlodipine due to LE edema. Now on benicar and HCTZ with significant improvement. She is also working out regularly and eating healthy which has helped.   Also reports a history dyspena on exertion for which she saw Cardiology in 2020. ECG there was reportedly normal. Exercise echo performed where she completed on an exercise Bruce achieving 10 METs. Echo normal with no WMA. No ischemia on exercise ECG. Duke Treadmill score 9.   She is currently very active and exercises 30-66minutes per day without chest pain, SOB, lightheadedness, dizziness, LE edema, or orthopnea. She is intermittently fasting and trying to lose weight.   Family history: father with MI at 33.   Past Medical History:  Diagnosis Date   HTN (hypertension)     Past Surgical History:  Procedure Laterality Date   NO PAST SURGERIES      Current Medications: Current Meds  Medication Sig   Calcium Carbonate-Vit D-Min (CALCIUM 1200 PO) Take by mouth daily.   celecoxib (CELEBREX) 100 MG capsule Take  100 mg by mouth daily.   Cholecalciferol (D3 PO) Take by mouth daily.   hydrochlorothiazide (HYDRODIURIL) 25 MG tablet Take 25 mg by mouth daily.   levothyroxine (SYNTHROID) 150 MCG tablet Take 150 mcg by mouth daily before breakfast.   MAGNESIUM PO Take by mouth daily.   Multiple Vitamins-Minerals (ZINC PO) Take by mouth daily.   olmesartan (BENICAR) 20 MG tablet Take 20 mg by mouth daily.   pantoprazole (PROTONIX) 20 MG tablet Take by mouth.   pravastatin (PRAVACHOL) 20 MG tablet Take 20 mg by mouth daily.   Turmeric 500 MG CAPS Take by mouth 2 (two) times daily.     Allergies:   Patient has no known allergies.   Social History   Socioeconomic History   Marital status: Married    Spouse name: Not on file   Number of children: Not on file   Years of education: Not on file   Highest education level: Not on file  Occupational History   Not on file  Tobacco Use   Smoking status: Never   Smokeless tobacco: Never  Substance and Sexual Activity   Alcohol use: Not on file   Drug use: Not on file   Sexual activity: Not on file  Other Topics Concern   Not on file  Social History Narrative   Not on file   Social Determinants of Health   Financial Resource Strain: Not on file  Food Insecurity: Not on file  Transportation Needs: Not on file  Physical Activity: Not on file  Stress: Not on file  Social Connections: Not on file     Family History: The patient's family history includes Breast cancer (age of onset: 36) in her mother; Breast cancer (age of onset: 24) in her maternal grandmother; Heart disease in her father.  ROS:   Please see the history of present illness.    Review of Systems  Constitutional:  Negative for chills and fever.  HENT:  Negative for hearing loss.   Eyes:  Negative for blurred vision.  Respiratory:  Negative for shortness of breath.   Cardiovascular:  Negative for chest pain, palpitations, orthopnea, claudication, leg swelling and PND.   Gastrointestinal:  Negative for nausea and vomiting.  Musculoskeletal:  Negative for falls.  Neurological:  Negative for dizziness and loss of consciousness.    EKGs/Labs/Other Studies Reviewed:    The following studies were reviewed today: CTA chest 01/02/21: FINDINGS: Cardiovascular: Accounting for motion artifact, no filling defect is identified in the pulmonary arterial tree to suggest pulmonary embolus. No acute vascular findings.   Mediastinum/Nodes: Unremarkable   Lungs/Pleura: Symmetric dependent atelectasis in both lower lobes. Mosaic attenuation is noted at the lung apices with some reduced conspicuity of vascular structures in the darker segments suggesting air trapping/small airways disease.   Upper Abdomen: Cholecystectomy.   Musculoskeletal: Unremarkable   Review of the MIP images confirms the above findings.   IMPRESSION: 1. No filling defect is identified in the pulmonary arterial tree to suggest pulmonary embolus. 2. Symmetric dependent atelectasis in both lower lobes the. 3. Air trapping in the lung apices suggesting small airways disease/bronchiolitis.  EKG:  EKG 01/02/21 NSR with poor r-wave progression  Recent Labs: 01/02/2021: BUN 29; Creatinine, Ser 1.40; Hemoglobin 12.3; Platelets 387; Potassium 3.3; Sodium 135; TSH 45.647  Recent Lipid Panel No results found for: CHOL, TRIG, HDL, CHOLHDL, VLDL, LDLCALC, LDLDIRECT   Risk Assessment/Calculations:           Physical Exam:    VS:  BP 126/80   Pulse 92   Ht 6\' 2"  (1.88 m)   Wt 221 lb 9.6 oz (100.5 kg)   SpO2 96%   BMI 28.45 kg/m     Wt Readings from Last 3 Encounters:  04/17/21 221 lb 9.6 oz (100.5 kg)  01/02/21 248 lb (112.5 kg)  06/10/19 228 lb (103.4 kg)     GEN:  Well nourished, well developed in no acute distress HEENT: Normal NECK: No JVD; No carotid bruits LYMPHATICS: No lymphadenopathy CARDIAC: RRR, no murmurs, rubs, gallops RESPIRATORY:  Clear to auscultation without rales,  wheezing or rhonchi  ABDOMEN: Soft, non-tender, non-distended MUSCULOSKELETAL:  No edema; No deformity  SKIN: Warm and dry NEUROLOGIC:  Alert and oriented x 3 PSYCHIATRIC:  Normal affect   ASSESSMENT:    1. Hyperlipidemia, unspecified hyperlipidemia type   2. Family history of early CAD    PLAN:    In order of problems listed above:  #Pleuritic Chest Pain: Resolved. Reassuring work-up in ER with normal trop, nonischemic ECG and no evidence of PE/coronary calcium on CTA. Stress echo in 2020 without ischemia. Currently very active without anginal or HF symptoms. -No further work-up needed  #HTN: Much better controlled. Goal <120s/80s -Continue HCTZ 25mg  daily -Continue benicar 20mg  daily -Could not tolerate lisinopril due to hyperK and elevated Cr -Unable to tolerate norvasc due to LE edema  #Family history of premature CAD: Father with MI at 49. Patient with HTN and HLD. Will  check Ca score. -Check Ca score  #HLD: LDL 101 in 01/2021.  -Check Ca score as above -Continue prava 20mg  daily for now and can adjust pending Ca score         Medication Adjustments/Labs and Tests Ordered: Current medicines are reviewed at length with the patient today.  Concerns regarding medicines are outlined above.  Orders Placed This Encounter  Procedures   CT CARDIAC SCORING (SELF PAY ONLY)   No orders of the defined types were placed in this encounter.   Patient Instructions  Medication Instructions:   Your physician recommends that you continue on your current medications as directed. Please refer to the Current Medication list given to you today.  *If you need a refill on your cardiac medications before your next appointment, please call your pharmacy*   Testing/Procedures:  CARDIAC CALCIUM SCORE DONE HERE IN THE OFFICE (SELF PAY)   Follow-Up: At [facility], you and your health needs are our priority.  As part of our continuing mission to provide you with exceptional  heart care, we have created designated Provider Care Teams.  These Care Teams include your primary Cardiologist (physician) and Advanced Practice Providers (APPs -  Physician Assistants and Nurse Practitioners) who all work together to provide you with the care you need, when you need it.  We recommend signing up for the patient portal called "MyChart".  Sign up information is provided on this After Visit Summary.  MyChart is used to connect with patients for Virtual Visits (Telemedicine).  Patients are able to view lab/test results, encounter notes, upcoming appointments, etc.  Non-urgent messages can be sent to your provider as well.   To learn more about what you can do with MyChart, go to [facility].    Your next appointment:   1 year(s)  The format for your next appointment:   In Person  Provider:    [email or web address]    Signed, Shari Prows, MD  04/17/2021 2:11 PM    [facility]

## 2021-04-17 ENCOUNTER — Ambulatory Visit: Payer: Federal, State, Local not specified - PPO | Admitting: Cardiology

## 2021-04-17 ENCOUNTER — Encounter: Payer: Self-pay | Admitting: Cardiology

## 2021-04-17 ENCOUNTER — Other Ambulatory Visit: Payer: Self-pay

## 2021-04-17 VITALS — BP 126/80 | HR 92 | Ht 74.0 in | Wt 221.6 lb

## 2021-04-17 DIAGNOSIS — Z8249 Family history of ischemic heart disease and other diseases of the circulatory system: Secondary | ICD-10-CM | POA: Diagnosis not present

## 2021-04-17 DIAGNOSIS — R0781 Pleurodynia: Secondary | ICD-10-CM | POA: Diagnosis not present

## 2021-04-17 DIAGNOSIS — E785 Hyperlipidemia, unspecified: Secondary | ICD-10-CM | POA: Diagnosis not present

## 2021-04-17 NOTE — Patient Instructions (Signed)
Medication Instructions:   Your physician recommends that you continue on your current medications as directed. Please refer to the Current Medication list given to you today.  *If you need a refill on your cardiac medications before your next appointment, please call your pharmacy*   Testing/Procedures:  CARDIAC CALCIUM SCORE DONE HERE IN THE OFFICE (SELF PAY)   Follow-Up: At [facility], you and your health needs are our priority.  As part of our continuing mission to provide you with exceptional heart care, we have created designated Provider Care Teams.  These Care Teams include your primary Cardiologist (physician) and Advanced Practice Providers (APPs -  Physician Assistants and Nurse Practitioners) who all work together to provide you with the care you need, when you need it.  We recommend signing up for the patient portal called "MyChart".  Sign up information is provided on this After Visit Summary.  MyChart is used to connect with patients for Virtual Visits (Telemedicine).  Patients are able to view lab/test results, encounter notes, upcoming appointments, etc.  Non-urgent messages can be sent to your provider as well.   To learn more about what you can do with MyChart, go to [email or web address].    Your next appointment:   1 year(s)  The format for your next appointment:   In Person  Provider:   DR. [place]  

## 2021-04-28 ENCOUNTER — Ambulatory Visit (INDEPENDENT_AMBULATORY_CARE_PROVIDER_SITE_OTHER)
Admission: RE | Admit: 2021-04-28 | Discharge: 2021-04-28 | Disposition: A | Discharge status: Home / Self Care | Payer: Self-pay | Source: Ambulatory Visit | Attending: Cardiology | Admitting: Cardiology

## 2021-04-28 ENCOUNTER — Other Ambulatory Visit: Payer: Self-pay

## 2021-04-28 DIAGNOSIS — Z8249 Family history of ischemic heart disease and other diseases of the circulatory system: Secondary | ICD-10-CM

## 2021-04-28 DIAGNOSIS — E785 Hyperlipidemia, unspecified: Secondary | ICD-10-CM

## 2021-05-02 ENCOUNTER — Telehealth: Payer: Self-pay | Admitting: *Deleted

## 2021-05-02 DIAGNOSIS — Z8249 Family history of ischemic heart disease and other diseases of the circulatory system: Secondary | ICD-10-CM

## 2021-05-02 DIAGNOSIS — E785 Hyperlipidemia, unspecified: Secondary | ICD-10-CM

## 2021-05-02 DIAGNOSIS — Z79899 Other long term (current) drug therapy: Secondary | ICD-10-CM

## 2021-05-02 MED ORDER — ROSUVASTATIN CALCIUM 10 MG PO TABS: 10.0000 mg | ORAL_TABLET | Freq: Every day | ORAL | 1 refills | Status: DC

## 2021-05-02 NOTE — Telephone Encounter (Signed)
The patient has been notified of the result and verbalized understanding.  All questions (if any) were answered. Loa Socks, LPN [id number]/7780 42:35 AM   Pt aware to stop her pravastatin and start crestor 10 mg po daily and come in for repeat lipids in 6-8 weeks. Confirmed the pharmacy of choice with the pt.  Pt states she will shoot me a mychart message with what day in the 2nd week of Jan 2023, would work with her work schedule, to come in for repeat labs.  She states she will be getting her Jan work schedule here soon.  Will await for pt to mychart back with lab appt day.  Order for lipids are placed.  Pt verbalized understanding and agrees with this plan.

## 2021-05-02 NOTE — Telephone Encounter (Signed)
-----   Message from Meriam Sprague, MD sent at 05/01/2021  8:19 PM EST ----- Her Calcium score is 6.7. This is only mildly elevated but because of her young age, she is in the 90% for her age/gender matched controls. To prevent her plaque from progressing with time, we should try to lower her cholesterol to a goal of closer to 70. Can we change her pravastatin 20mg  to crestor 10mg  and recheck lipids in 6-8 weeks?

## 2021-06-29 ENCOUNTER — Other Ambulatory Visit: Payer: Federal, State, Local not specified - PPO

## 2021-06-29 ENCOUNTER — Other Ambulatory Visit: Payer: Self-pay

## 2021-06-29 DIAGNOSIS — Z79899 Other long term (current) drug therapy: Secondary | ICD-10-CM

## 2021-06-29 DIAGNOSIS — Z8249 Family history of ischemic heart disease and other diseases of the circulatory system: Secondary | ICD-10-CM

## 2021-06-29 DIAGNOSIS — H15102 Unspecified episcleritis, left eye: Secondary | ICD-10-CM | POA: Diagnosis not present

## 2021-06-29 DIAGNOSIS — R079 Chest pain, unspecified: Secondary | ICD-10-CM | POA: Diagnosis not present

## 2021-06-29 DIAGNOSIS — E785 Hyperlipidemia, unspecified: Secondary | ICD-10-CM

## 2021-06-29 DIAGNOSIS — M940 Chondrocostal junction syndrome [Tietze]: Secondary | ICD-10-CM | POA: Diagnosis not present

## 2021-06-29 LAB — LIPID PANEL
Chol/HDL Ratio: 3.6 ratio (ref 0.0–4.4)
Cholesterol, Total: 116 mg/dL (ref 100–199)
HDL: 32 mg/dL — ABNORMAL LOW (ref >39)
LDL Chol Calc (NIH): 65 mg/dL (ref 0–99)
Triglycerides: 101 mg/dL (ref 0–149)
VLDL Cholesterol Cal: 19 mg/dL (ref 5–40)

## 2021-07-06 DIAGNOSIS — N939 Abnormal uterine and vaginal bleeding, unspecified: Secondary | ICD-10-CM | POA: Diagnosis not present

## 2021-08-17 ENCOUNTER — Other Ambulatory Visit: Payer: Self-pay | Admitting: Obstetrics and Gynecology

## 2021-08-17 DIAGNOSIS — E039 Hypothyroidism, unspecified: Secondary | ICD-10-CM | POA: Diagnosis not present

## 2021-08-17 DIAGNOSIS — I10 Essential (primary) hypertension: Secondary | ICD-10-CM | POA: Diagnosis not present

## 2021-08-17 DIAGNOSIS — Z1231 Encounter for screening mammogram for malignant neoplasm of breast: Secondary | ICD-10-CM

## 2021-10-16 ENCOUNTER — Ambulatory Visit
Admission: RE | Admit: 2021-10-16 | Discharge: 2021-10-16 | Disposition: A | Discharge status: Home / Self Care | Payer: Federal, State, Local not specified - PPO | Source: Ambulatory Visit | Attending: Obstetrics and Gynecology | Admitting: Obstetrics and Gynecology

## 2021-10-16 DIAGNOSIS — Z1231 Encounter for screening mammogram for malignant neoplasm of breast: Secondary | ICD-10-CM

## 2021-10-16 DIAGNOSIS — Z01419 Encounter for gynecological examination (general) (routine) without abnormal findings: Secondary | ICD-10-CM | POA: Diagnosis not present

## 2021-10-16 DIAGNOSIS — Z6826 Body mass index (BMI) 26.0-26.9, adult: Secondary | ICD-10-CM | POA: Diagnosis not present

## 2021-10-16 DIAGNOSIS — N939 Abnormal uterine and vaginal bleeding, unspecified: Secondary | ICD-10-CM | POA: Diagnosis not present

## 2021-11-08 ENCOUNTER — Other Ambulatory Visit: Payer: Self-pay

## 2021-11-08 DIAGNOSIS — E785 Hyperlipidemia, unspecified: Secondary | ICD-10-CM

## 2021-11-08 DIAGNOSIS — Z79899 Other long term (current) drug therapy: Secondary | ICD-10-CM

## 2021-11-08 DIAGNOSIS — Z8249 Family history of ischemic heart disease and other diseases of the circulatory system: Secondary | ICD-10-CM

## 2021-11-08 MED ORDER — ROSUVASTATIN CALCIUM 10 MG PO TABS: 10.0000 mg | ORAL_TABLET | Freq: Every day | ORAL | 1 refills | Status: DC

## 2021-11-23 DIAGNOSIS — Z86010 Personal history of colonic polyps: Secondary | ICD-10-CM | POA: Diagnosis not present

## 2021-11-23 DIAGNOSIS — K219 Gastro-esophageal reflux disease without esophagitis: Secondary | ICD-10-CM | POA: Diagnosis not present

## 2021-11-23 DIAGNOSIS — K429 Umbilical hernia without obstruction or gangrene: Secondary | ICD-10-CM | POA: Diagnosis not present

## 2021-11-23 DIAGNOSIS — K5904 Chronic idiopathic constipation: Secondary | ICD-10-CM | POA: Diagnosis not present

## 2021-12-22 DIAGNOSIS — K219 Gastro-esophageal reflux disease without esophagitis: Secondary | ICD-10-CM | POA: Diagnosis not present

## 2021-12-22 DIAGNOSIS — R1013 Epigastric pain: Secondary | ICD-10-CM | POA: Diagnosis not present

## 2021-12-22 DIAGNOSIS — Z13220 Encounter for screening for lipoid disorders: Secondary | ICD-10-CM | POA: Diagnosis not present

## 2021-12-22 DIAGNOSIS — E039 Hypothyroidism, unspecified: Secondary | ICD-10-CM | POA: Diagnosis not present

## 2021-12-22 DIAGNOSIS — Z1321 Encounter for screening for nutritional disorder: Secondary | ICD-10-CM | POA: Diagnosis not present

## 2021-12-22 DIAGNOSIS — M940 Chondrocostal junction syndrome [Tietze]: Secondary | ICD-10-CM | POA: Diagnosis not present

## 2021-12-22 DIAGNOSIS — E785 Hyperlipidemia, unspecified: Secondary | ICD-10-CM | POA: Diagnosis not present

## 2021-12-22 DIAGNOSIS — R748 Abnormal levels of other serum enzymes: Secondary | ICD-10-CM | POA: Diagnosis not present

## 2021-12-22 DIAGNOSIS — F419 Anxiety disorder, unspecified: Secondary | ICD-10-CM | POA: Diagnosis not present

## 2021-12-28 DIAGNOSIS — H15102 Unspecified episcleritis, left eye: Secondary | ICD-10-CM | POA: Diagnosis not present

## 2021-12-28 DIAGNOSIS — R079 Chest pain, unspecified: Secondary | ICD-10-CM | POA: Diagnosis not present

## 2021-12-28 DIAGNOSIS — M940 Chondrocostal junction syndrome [Tietze]: Secondary | ICD-10-CM | POA: Diagnosis not present

## 2021-12-28 DIAGNOSIS — Z79899 Other long term (current) drug therapy: Secondary | ICD-10-CM | POA: Diagnosis not present

## 2022-01-09 NOTE — Progress Notes (Unsigned)
Office Visit    Patient Name: Diane Morales Date of Encounter: 01/11/2022  Primary Care Provider:  Chesley Noon, MD Primary Cardiologist:  None Primary Electrophysiologist: None  Chief Complaint    Diane Morales is a 51 y.o. female with PMH of HTN, atypical chest pain who presents today for follow-up of hypertension and chest pain.  Past Medical History    Past Medical History:  Diagnosis Date   HTN (hypertension)    Allergies  No Known Allergies  History of Present Illness    Diane Morales is a 52 year old female with the above-mentioned past medical history who presents today for follow-up of chest pain and hypertension.  She was initially seen by Dr. Johney Frame on 03/2021 for further evaluation of chest pain and shortness of breath.  She has been seen in the ED on 12/2020 for complaint of chest pain.  Troponins and ECG were both normal.  CTA of the chest was performed and showed no evidence of PE or significant CAD present.  Patient was discharged home with instructions to follow-up with cardiology.  Patient was noted to have hypertension and was treated with lisinopril initially but was stopped due to elevated creatinine and potassium.  She suffered lower extremity edema with amlodipine and is now being treated with Benicar and HCTZ.  She completed exercise treadmill in 2020 for complaint of shortness of breath that revealed no ischemia and Duke treadmill score of 9.  She was noted to have strong family history of CAD with father suffering MI at age 75.  She was sent for coronary calcium score and was 6.7 patient was switched from pravastatin to Crestor for primary prevention.    She was recently seen in the ED in [place] on 12/15/2021 for complaint of left-sided sharp chest pain Troponins were negative and ECG without acute findings.  Chest x-ray was performed that showed trace pleural effusion and left basilar atelectasis.  She was treated with Toradol for  costochondritis pain and discharged in stable condition.  Since last being seen in the office patient reports that she has since experienced increased chest pain and some shortness of breath.  She was seen in the emergency room in [place] due to increased chest pain that occurred while driving back home.  ED work-up was negative for ACS and patient was discharged with Toradol for chest pain.  She reports however her pain has been ongoing and she has had difficulty lying flat and sleeping.  She denies any orthopnea but does endorse chest pain while lying flat. Patient denies, palpitations, dyspnea, PND, orthopnea, nausea, vomiting, dizziness, syncope, edema, weight gain, or early satiety.   Home Medications    Current Outpatient Medications  Medication Sig Dispense Refill   buPROPion (WELLBUTRIN XL) 150 MG 24 hr tablet Take 150 mg by mouth every morning.     Calcium Carbonate-Vit D-Min (CALCIUM 1200 PO) Take by mouth daily.     celecoxib (CELEBREX) 100 MG capsule Take 100 mg by mouth daily.     Cholecalciferol (D3 PO) Take by mouth daily.     hydrochlorothiazide (HYDRODIURIL) 25 MG tablet Take 25 mg by mouth daily.     levothyroxine (SYNTHROID) 150 MCG tablet Take 150 mcg by mouth daily before breakfast.     MAGNESIUM PO Take by mouth daily.     olmesartan (BENICAR) 20 MG tablet Take 20 mg by mouth daily.     pantoprazole (PROTONIX) 40 MG tablet Take 1 tablet by mouth daily.  rosuvastatin (CRESTOR) 10 MG tablet Take 1 tablet (10 mg total) by mouth daily. 90 tablet 1   Turmeric 500 MG CAPS Take by mouth 2 (two) times daily.     No current facility-administered medications for this visit.     Review of Systems  Please see the history of present illness.    (+) Left-sided chest pain (+) Shortness of breath  All other systems reviewed and are otherwise negative except as noted above.  Physical Exam    Wt Readings from Last 3 Encounters:  01/11/22 215 lb (97.5 kg)  04/17/21 221 lb 9.6  oz (100.5 kg)  01/02/21 248 lb (112.5 kg)   VS: Vitals:   01/11/22 1549  BP: 126/80  Pulse: 70  SpO2: 98%  ,Body mass index is 27.6 kg/m.  Constitutional:      Appearance: Healthy appearance. Not in distress.  Neck:     Vascular: JVD normal.  Pulmonary:     Effort: Pulmonary effort is normal.     Breath sounds: No wheezing. Decreased breath sounds on right base with diminished in the left base Cardiovascular:     Normal rate. Regular rhythm. Normal S1. Normal S2.      Murmurs: There is no murmur.  Edema:    Peripheral edema absent.  Abdominal:     Palpations: Abdomen is soft non tender. There is no hepatomegaly.  Skin:    General: Skin is warm and dry.  Neurological:     General: No focal deficit present.     Mental Status: Alert and oriented to person, place and time.     Cranial Nerves: Cranial nerves are intact.  EKG/LABS/Other Studies Reviewed    ECG personally reviewed by me today -normal sinus rhythm with rate of 70 and normal axis with no acute changes  Lab Results  Component Value Date   WBC 12.3 (H) 01/02/2021   HGB 12.3 01/02/2021   HCT 37.0 01/02/2021   MCV 83.3 01/02/2021   PLT 387 01/02/2021   Lab Results  Component Value Date   CREATININE 1.40 (H) 01/02/2021   BUN 29 (H) 01/02/2021   NA 135 01/02/2021   K 3.3 (L) 01/02/2021   CL 100 01/02/2021   CO2 26 01/02/2021   No results found for: "ALT", "AST", "GGT", "ALKPHOS", "BILITOT" Lab Results  Component Value Date   CHOL 116 06/29/2021   HDL 32 (L) 06/29/2021   LDLCALC 65 06/29/2021   TRIG 101 06/29/2021   CHOLHDL 3.6 06/29/2021    No results found for: "HGBA1C"  Assessment & Plan    1.  Pleuritic chest pain: -Patient reports today that her chest pain has been ongoing and unresolved with NSAIDs and rest. -She reports some shortness of breath and increased chest tightness with moderate exercise -We will start Toprol-XL 12.5 mg daily for stable angina  2.  HTN: -Patient's blood pressure  today was well controlled at 126/80 -Continue olmesartan 20 mg daily, and HCTZ 25 mg  3.  HLD: -Patient's last LDL cholesterol was 98 triglycerides 157 -Continue Crestor 10 mg daily -Continue low-sodium heart healthy diet with moderate exercise as tolerated  4.  Family history of CAD: -Patient continues to endorse anginal symptoms today -Exercise treadmill completed 2020 that was normal, no evidence of CAD on chest CT  -Due to her strong family history and ongoing anginal symptoms we will have her complete stress echo to evaluate for possible ischemia. -Patient advised to return to ED if chest pain becomes unrelenting and does  not resolve with rest.   Disposition: Follow-up with None or APP in 1 months  Shared Decision Making/Informed Consent The risks [chest pain, shortness of breath, cardiac arrhythmias, dizziness, blood pressure fluctuations, myocardial infarction, stroke/transient ischemic attack, and life-threatening complications (estimated to be 1 in 10,000)], benefits (risk stratification, diagnosing coronary artery disease, treatment guidance) and alternatives of a stress or dobutamine stress echocardiogram were discussed in detail with Diane Morales and she agrees to proceed.   Medication Adjustments/Labs and Tests Ordered: Current medicines are reviewed at length with the patient today.  Concerns regarding medicines are outlined above.   Signed, Mable Fill, Marissa Nestle, NP 01/11/2022, 4:31 PM [place]

## 2022-01-11 ENCOUNTER — Ambulatory Visit: Payer: Federal, State, Local not specified - PPO | Admitting: Nurse Practitioner

## 2022-01-11 ENCOUNTER — Encounter: Payer: Self-pay | Admitting: Nurse Practitioner

## 2022-01-11 VITALS — BP 126/80 | HR 70 | Ht 74.0 in | Wt 215.0 lb

## 2022-01-11 DIAGNOSIS — Z8249 Family history of ischemic heart disease and other diseases of the circulatory system: Secondary | ICD-10-CM

## 2022-01-11 DIAGNOSIS — I10 Essential (primary) hypertension: Secondary | ICD-10-CM | POA: Diagnosis not present

## 2022-01-11 DIAGNOSIS — R0602 Shortness of breath: Secondary | ICD-10-CM

## 2022-01-11 DIAGNOSIS — R0781 Pleurodynia: Secondary | ICD-10-CM

## 2022-01-11 DIAGNOSIS — E785 Hyperlipidemia, unspecified: Secondary | ICD-10-CM | POA: Diagnosis not present

## 2022-01-11 MED ORDER — METOPROLOL SUCCINATE ER 25 MG PO TB24: 12.5000 mg | ORAL_TABLET | Freq: Every day | ORAL | 3 refills | Status: DC

## 2022-01-11 NOTE — Patient Instructions (Signed)
Medication Instructions:  START METOPROLOL 12.5 MG EVERY DAY *If you need a refill on your cardiac medications before your next appointment, please call your pharmacy*   Lab Work: NONE If you have labs (blood work) drawn today and your tests are completely normal, you will receive your results only by: [place] (if you have MyChart) OR A paper copy in the mail If you have any lab test that is abnormal or we need to change your treatment, we will call you to review the results.   Testing/Procedures: Your physician has requested that you have a stress echocardiogram. For further information please visit [email or web address]. Please follow instruction sheet as given.   A chest x-ray takes a picture of the organs and structures inside the chest, including the heart, lungs, and blood vessels. This test can show several things, including, whether the heart is enlarges; whether fluid is building up in the lungs; and whether pacemaker / defibrillator leads are still in place.       Follow-Up: At [facility], you and your health needs are our priority.  As part of our continuing mission to provide you with exceptional heart care, we have created designated Provider Care Teams.  These Care Teams include your primary Cardiologist (physician) and Advanced Practice Providers (APPs -  Physician Assistants and Nurse Practitioners) who all work together to provide you with the care you need, when you need it.  We recommend signing up for the patient portal called "MyChart".  Sign up information is provided on this After Visit Summary.  MyChart is used to connect with patients for Virtual Visits (Telemedicine).  Patients are able to view lab/test results, encounter notes, upcoming appointments, etc.  Non-urgent messages can be sent to your provider as well.   To learn more about what you can do with MyChart, go to [email or web address].    Your next appointment:   1 month(s)  The format for  your next appointment:   In Person  Provider:   Ambrose Pancoast, NP          Other Instructions NONE  Important Information About Sugar

## 2022-01-12 ENCOUNTER — Ambulatory Visit
Admission: RE | Admit: 2022-01-12 | Discharge: 2022-01-12 | Disposition: A | Discharge status: Home / Self Care | Payer: Federal, State, Local not specified - PPO | Source: Ambulatory Visit | Attending: Nurse Practitioner | Admitting: Nurse Practitioner

## 2022-01-12 DIAGNOSIS — R0602 Shortness of breath: Secondary | ICD-10-CM

## 2022-01-25 ENCOUNTER — Ambulatory Visit (HOSPITAL_COMMUNITY)
Admission: RE | Admit: 2022-01-25 | Discharge: 2022-01-25 | Disposition: A | Discharge status: Home / Self Care | Payer: Federal, State, Local not specified - PPO | Source: Ambulatory Visit | Attending: Physician Assistant | Admitting: Physician Assistant

## 2022-01-25 ENCOUNTER — Other Ambulatory Visit (HOSPITAL_COMMUNITY): Payer: Self-pay | Admitting: Physician Assistant

## 2022-01-25 DIAGNOSIS — M533 Sacrococcygeal disorders, not elsewhere classified: Secondary | ICD-10-CM

## 2022-01-26 DIAGNOSIS — I10 Essential (primary) hypertension: Secondary | ICD-10-CM | POA: Diagnosis not present

## 2022-01-26 DIAGNOSIS — M533 Sacrococcygeal disorders, not elsewhere classified: Secondary | ICD-10-CM | POA: Diagnosis not present

## 2022-01-26 DIAGNOSIS — M722 Plantar fascial fibromatosis: Secondary | ICD-10-CM | POA: Diagnosis not present

## 2022-02-05 ENCOUNTER — Telehealth (HOSPITAL_COMMUNITY): Payer: Self-pay

## 2022-02-05 NOTE — Telephone Encounter (Signed)
Spoke with the patient, detailed instructions given. She stated that she would be here for her test. Asked to call back with any questions. S.Gerik Coberly EMTP 

## 2022-02-06 ENCOUNTER — Ambulatory Visit (HOSPITAL_COMMUNITY): Payer: Federal, State, Local not specified - PPO

## 2022-02-06 ENCOUNTER — Ambulatory Visit (HOSPITAL_COMMUNITY): Payer: Federal, State, Local not specified - PPO | Attending: Cardiology

## 2022-02-06 DIAGNOSIS — R0602 Shortness of breath: Secondary | ICD-10-CM | POA: Insufficient documentation

## 2022-02-06 DIAGNOSIS — R0781 Pleurodynia: Secondary | ICD-10-CM | POA: Insufficient documentation

## 2022-02-06 DIAGNOSIS — R262 Difficulty in walking, not elsewhere classified: Secondary | ICD-10-CM | POA: Diagnosis not present

## 2022-02-06 DIAGNOSIS — M79672 Pain in left foot: Secondary | ICD-10-CM | POA: Diagnosis not present

## 2022-02-06 LAB — ECHOCARDIOGRAM STRESS TEST
Area-P 1/2: 3.21 cm2
S' Lateral: 3 cm

## 2022-02-06 MED ORDER — PERFLUTREN LIPID MICROSPHERE
6.0000 mL | INTRAVENOUS | Status: AC | PRN
  Administered 2022-02-06: 6 mL via INTRAVENOUS

## 2022-02-14 NOTE — Progress Notes (Signed)
Office Visit    Patient Name: Diane Morales Date of Encounter: 02/16/2022  Primary Care Provider:  Chesley Noon, MD Primary Cardiologist:  None Primary Electrophysiologist: None  Chief Complaint    Diane Morales is a 51 y.o. female with PMH of HTN, atypical chest pain who presents today for follow-up of hypertension and chest pain.  Past Medical History    Past Medical History:  Diagnosis Date   HTN (hypertension)    Past Surgical History:  Procedure Laterality Date   NO PAST SURGERIES      Allergies  Allergies  Allergen Reactions   Gluten Meal     Other reaction(s): Other (See Comments) Celiac Celiac     History of Present Illness   Diane Morales is a 51 year old female with the above-mentioned past medical history who presents today for follow-up of chest pain and hypertension.  She was initially seen by Dr. Johney Frame on 03/2021 for further evaluation of chest pain and shortness of breath.  She has been seen in the ED on 12/2020 for complaint of chest pain.  Troponins and ECG were both normal.  CTA of the chest was performed and showed no evidence of PE or significant CAD present.  Patient was discharged home with instructions to follow-up with cardiology.  Patient was noted to have hypertension and was treated with lisinopril initially but was stopped due to elevated creatinine and potassium.  She suffered lower extremity edema with amlodipine and is now being treated with Benicar and HCTZ.  She completed exercise treadmill in 2020 for complaint of shortness of breath that revealed no ischemia and Duke treadmill score of 9.  She was noted to have strong family history of CAD with father suffering MI at age 36.  She was sent for coronary calcium score and was 6.7 patient was switched from pravastatin to Crestor for primary prevention. She was recently seen in the ED in [place] on 12/15/2021 for complaint of left-sided sharp chest pain Troponins were negative and  ECG without acute findings.  Chest x-ray was performed that showed trace pleural effusion and left basilar atelectasis.  She was treated with Toradol for costochondritis pain and discharged in stable condition.   Ms. Ottey presents today for 1 month follow-up.  Since last being seen in the office patient reports she is feeling much better and reports no angina or shortness of breath.  She is tolerating her medications without any adverse effects.  She is currently dealing with really bad plantar fasciitis and is unable to exercise.  She is getting physical therapy and occupational therapy to help rehab this injury.  Blood pressures today were well controlled at 124/80 and heart rate was 66.  During our visit we reviewed her stress echo report and answered questions that she had regarding the test.  Patient denies chest pain, palpitations, dyspnea, PND, orthopnea, nausea, vomiting, dizziness, syncope, edema, weight gain, or early satiety.  Home Medications    Current Outpatient Medications  Medication Sig Dispense Refill   buPROPion (WELLBUTRIN XL) 150 MG 24 hr tablet Take 150 mg by mouth every morning.     Calcium Carbonate-Vit D-Min (CALCIUM 1200 PO) Take by mouth daily.     celecoxib (CELEBREX) 100 MG capsule Take 100 mg by mouth daily. Mondays, Wednesdays, and Fridays     Cholecalciferol (D3 PO) Take by mouth daily.     hydrochlorothiazide (HYDRODIURIL) 25 MG tablet Take 25 mg by mouth daily.     levothyroxine (SYNTHROID) 150 MCG tablet  Take 125 mcg by mouth daily before breakfast.     MAGNESIUM PO Take by mouth daily.     metoprolol succinate (TOPROL-XL) 25 MG 24 hr tablet Take 0.5 tablets (12.5 mg total) by mouth daily. 45 tablet 3   Multiple Vitamins-Minerals (ZINC PO) Take 1 tablet by mouth daily.     olmesartan (BENICAR) 20 MG tablet Take 20 mg by mouth daily.     pantoprazole (PROTONIX) 40 MG tablet Take 1 tablet by mouth daily.     rosuvastatin (CRESTOR) 10 MG tablet Take 1 tablet (10  mg total) by mouth daily. 90 tablet 1   Turmeric 500 MG CAPS Take by mouth 2 (two) times daily.     No current facility-administered medications for this visit.     Review of Systems  Please see the history of present illness.    (+) Plantar fasciitis and right foot All other systems reviewed and are otherwise negative except as noted above.  Physical Exam    Wt Readings from Last 3 Encounters:  02/16/22 215 lb 6.4 oz (97.7 kg)  01/11/22 215 lb (97.5 kg)  04/17/21 221 lb 9.6 oz (100.5 kg)   VS: Vitals:   02/16/22 1556  BP: 124/80  Pulse: 66  SpO2: 98%  ,Body mass index is 27.66 kg/m.  Constitutional:      Appearance: Healthy appearance. Not in distress.  Neck:     Vascular: JVD normal.  Pulmonary:     Effort: Pulmonary effort is normal.     Breath sounds: No wheezing. No rales. Diminished in the bases Cardiovascular:     Normal rate. Regular rhythm. Normal S1. Normal S2.      Murmurs: There is no murmur.  Edema:    Peripheral edema absent.  Abdominal:     Palpations: Abdomen is soft non tender. There is no hepatomegaly.  Skin:    General: Skin is warm and dry.  Neurological:     General: No focal deficit present.     Mental Status: Alert and oriented to person, place and time.     Cranial Nerves: Cranial nerves are intact.  EKG/LABS/Other Studies Reviewed    ECG personally reviewed by me today -none completed today   Lab Results  Component Value Date   WBC 12.3 (H) 01/02/2021   HGB 12.3 01/02/2021   HCT 37.0 01/02/2021   MCV 83.3 01/02/2021   PLT 387 01/02/2021   Lab Results  Component Value Date   CREATININE 1.40 (H) 01/02/2021   BUN 29 (H) 01/02/2021   NA 135 01/02/2021   K 3.3 (L) 01/02/2021   CL 100 01/02/2021   CO2 26 01/02/2021   No results found for: "ALT", "AST", "GGT", "ALKPHOS", "BILITOT" Lab Results  Component Value Date   CHOL 116 06/29/2021   HDL 32 (L) 06/29/2021   LDLCALC 65 06/29/2021   TRIG 101 06/29/2021   CHOLHDL 3.6  06/29/2021    No results found for: "HGBA1C"  Assessment & Plan    1.  Pleuritic chest pain: -Patient reports today that her shortness of breath and chest discomfort has resolved -Continue Toprol X they will 12.5 mg    2.  HTN: -Patient's blood pressure today was well controlled at 124/80 -Continue olmesartan 20 mg daily, Toprol-XL and HCTZ 25 mg   3.  HLD: -Patient's last LDL cholesterol was 98 triglycerides 157 -Continue Crestor 10 mg daily -Continue low-sodium heart healthy diet with moderate exercise as tolerated   4.  Family history of CAD: -  Patient had stress echo completed that was low risk with no ischemia -Today patient endorses no chest discomfort or shortness of breath. -She is planning to advance her physical activity as tolerated  Disposition: Follow-up with None or APP in as needed    Medication Adjustments/Labs and Tests Ordered: Current medicines are reviewed at length with the patient today.  Concerns regarding medicines are outlined above.   Signed, Mable Fill, Marissa Nestle, NP 02/16/2022, 4:22 PM [place]

## 2022-02-16 ENCOUNTER — Ambulatory Visit: Payer: Federal, State, Local not specified - PPO | Attending: Nurse Practitioner | Admitting: Nurse Practitioner

## 2022-02-16 ENCOUNTER — Encounter: Payer: Self-pay | Admitting: Nurse Practitioner

## 2022-02-16 VITALS — BP 124/80 | HR 66 | Ht 74.0 in | Wt 215.4 lb

## 2022-02-16 DIAGNOSIS — Z8249 Family history of ischemic heart disease and other diseases of the circulatory system: Secondary | ICD-10-CM

## 2022-02-16 DIAGNOSIS — E785 Hyperlipidemia, unspecified: Secondary | ICD-10-CM | POA: Diagnosis not present

## 2022-02-16 DIAGNOSIS — I10 Essential (primary) hypertension: Secondary | ICD-10-CM

## 2022-02-16 DIAGNOSIS — R0781 Pleurodynia: Secondary | ICD-10-CM

## 2022-02-16 NOTE — Patient Instructions (Signed)
Medication Instructions:  Your physician recommends that you continue on your current medications as directed. Please refer to the Current Medication list given to you today.   *If you need a refill on your cardiac medications before your next appointment, please call your pharmacy*  Follow-Up: At [facility], you and your health needs are our priority.  As part of our continuing mission to provide you with exceptional heart care, we have created designated Provider Care Teams.  These Care Teams include your primary Cardiologist (physician) and Advanced Practice Providers (APPs -  Physician Assistants and Nurse Practitioners) who all work together to provide you with the care you need, when you need it.  We recommend signing up for the patient portal called "MyChart".  Sign up information is provided on this After Visit Summary.  MyChart is used to connect with patients for Virtual Visits (Telemedicine).  Patients are able to view lab/test results, encounter notes, upcoming appointments, etc.  Non-urgent messages can be sent to your provider as well.   To learn more about what you can do with MyChart, go to [email or web address].    Your next appointment:   As needed  Provider:   Dr. Johney Frame   Important Information About Sugar

## 2022-03-05 DIAGNOSIS — K429 Umbilical hernia without obstruction or gangrene: Secondary | ICD-10-CM | POA: Insufficient documentation

## 2022-03-05 DIAGNOSIS — M722 Plantar fascial fibromatosis: Secondary | ICD-10-CM | POA: Insufficient documentation

## 2022-03-05 DIAGNOSIS — M7581 Other shoulder lesions, right shoulder: Secondary | ICD-10-CM | POA: Insufficient documentation

## 2022-03-07 ENCOUNTER — Ambulatory Visit: Payer: Federal, State, Local not specified - PPO | Admitting: Cardiology

## 2022-03-13 IMAGING — MG MM DIGITAL SCREENING BILAT W/ TOMO AND CAD
6 of 12 series · 6 of 36 positions shown · non-contrast
Comparison: Previous exam(s).

CLINICAL DATA: Screening.

EXAM:
DIGITAL SCREENING BILATERAL MAMMOGRAM WITH TOMOSYNTHESIS AND CAD
TECHNIQUE: Bilateral screening digital craniocaudal and mediolateral oblique
mammograms were obtained. Bilateral screening digital breast
tomosynthesis was performed. The images were evaluated with
computer-aided detection.

[R MLO synth-2D]
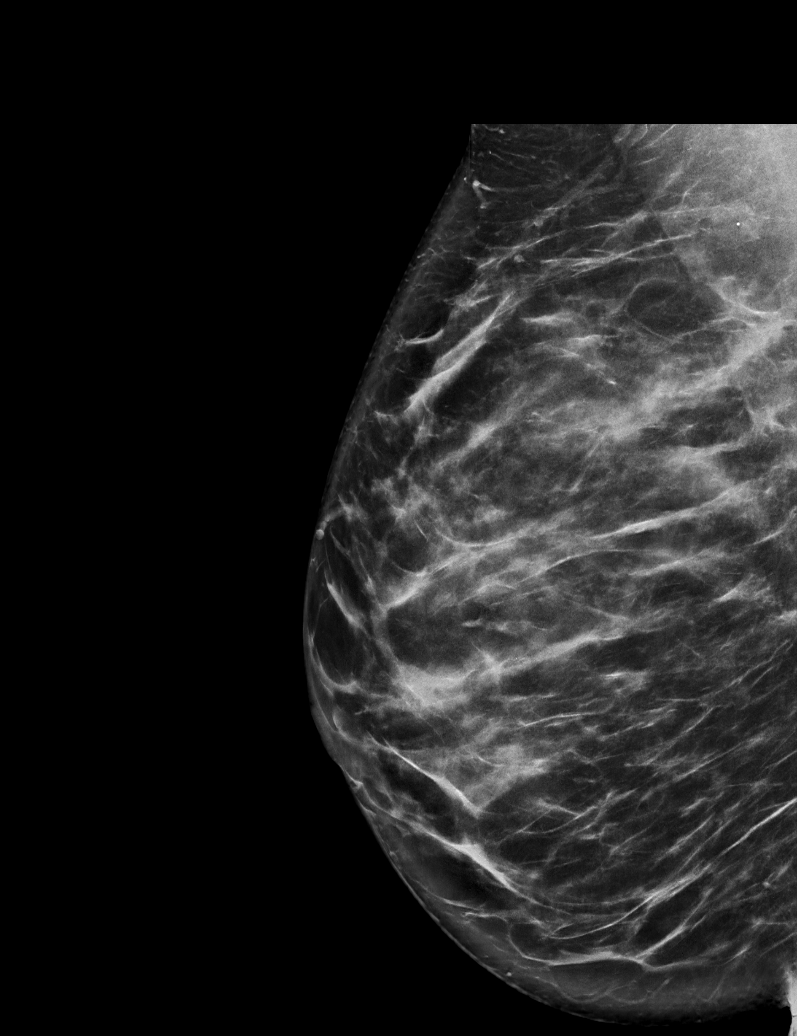

[L MLO synth-2D]
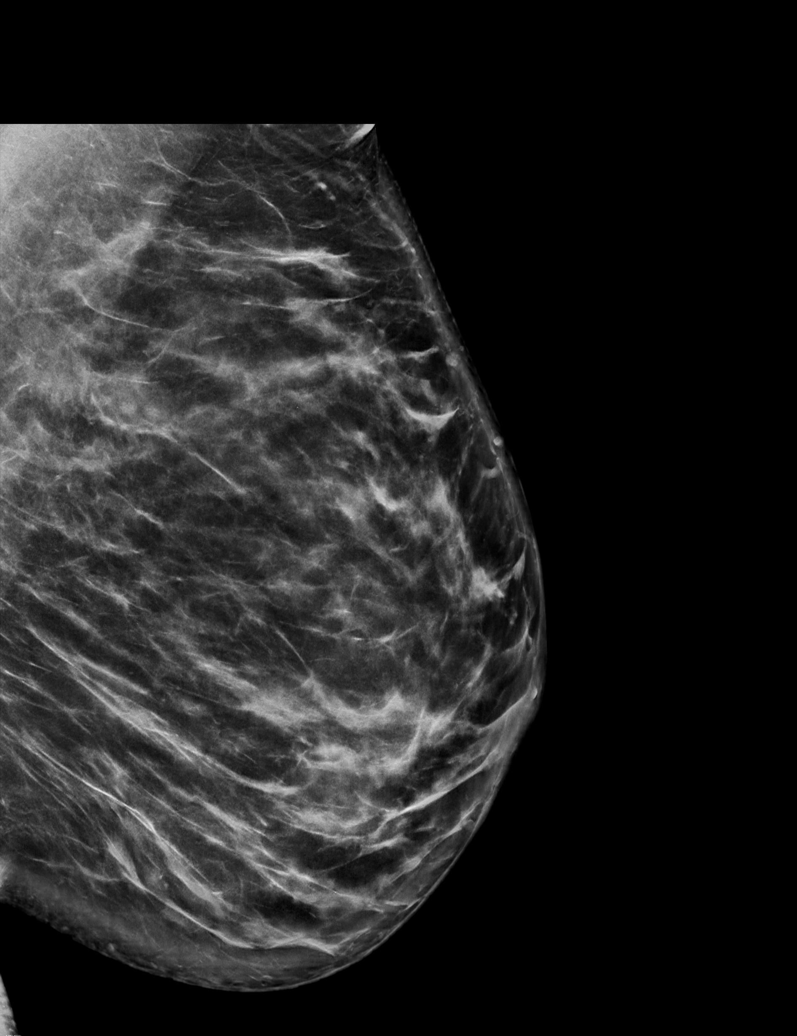

[R CC synth-2D]
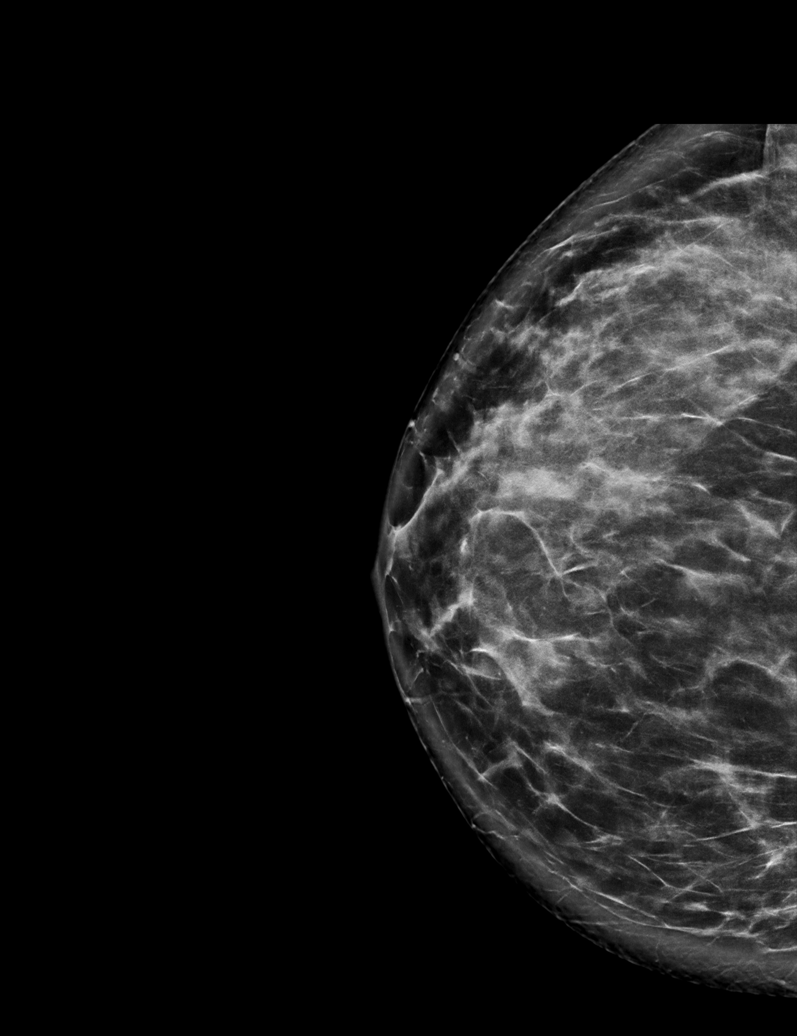

[L CC synth-2D]
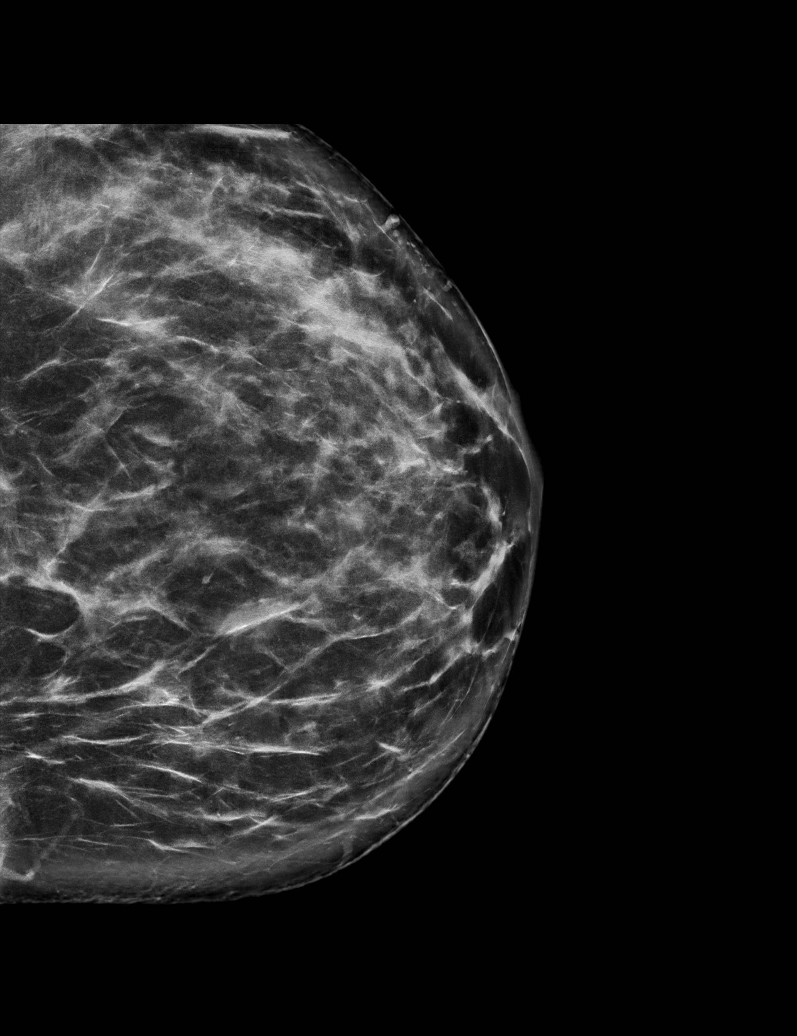

[L XCCL synth-2D]
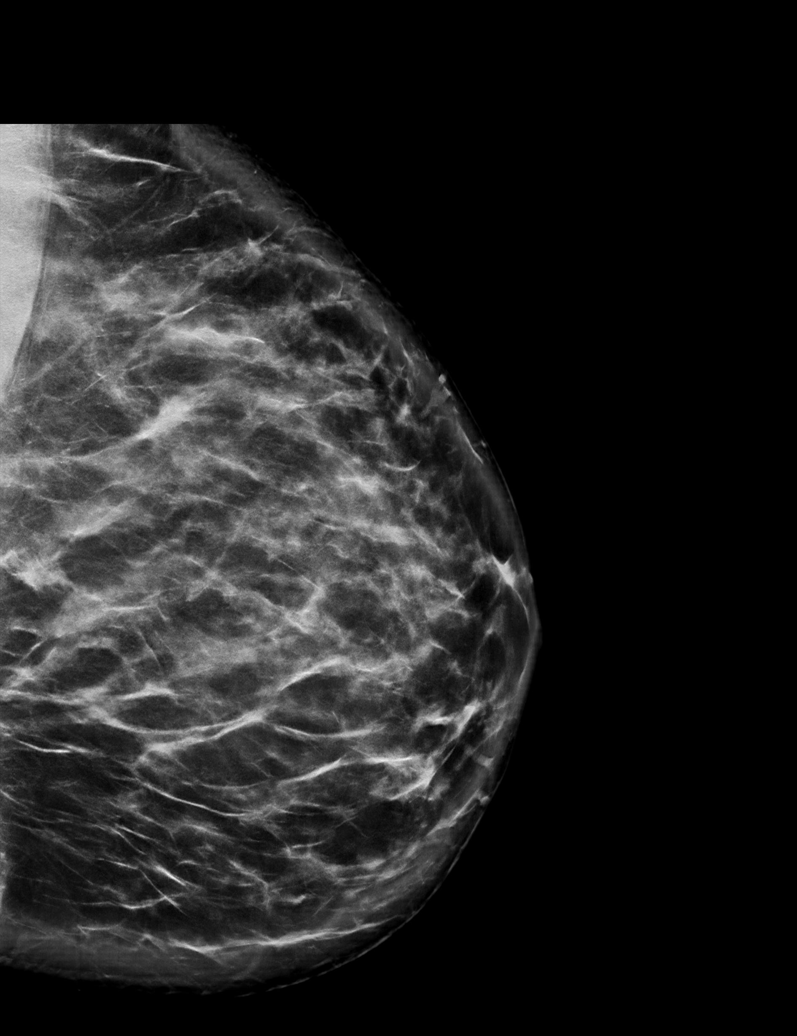

[R XCCL synth-2D]
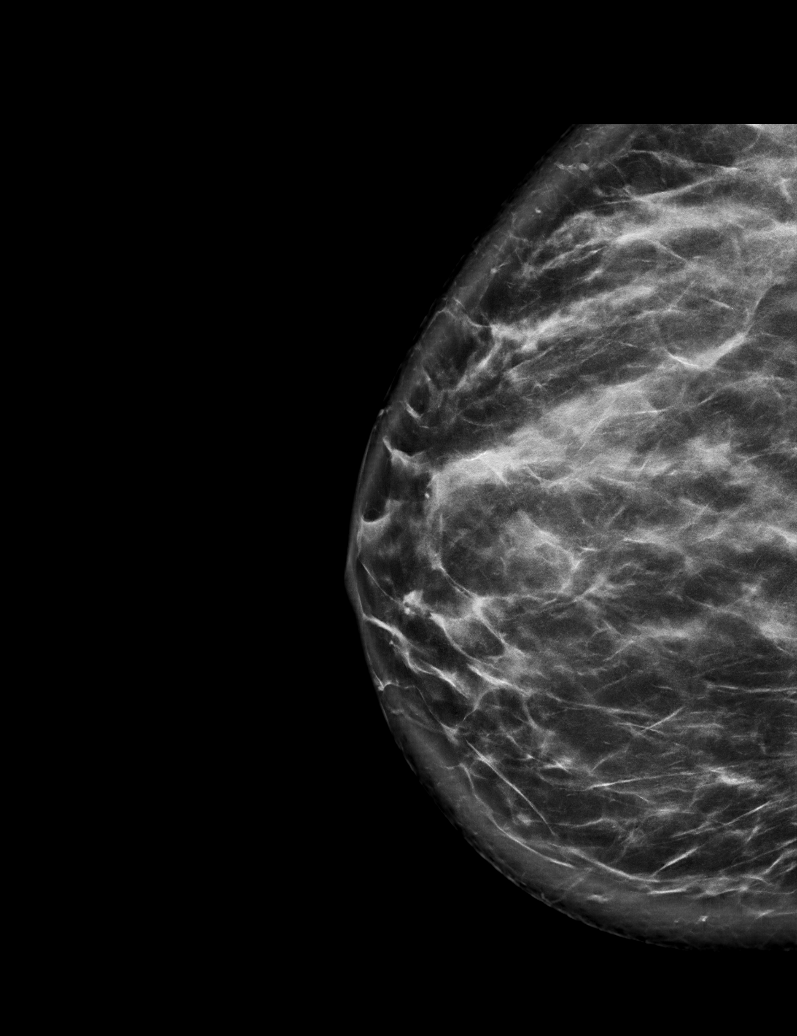

[6 of 36 positions shown; findings below may reference images not displayed]

ACR Breast Density Category c: The breast tissue is heterogeneously
dense, which may obscure small masses.
FINDINGS: There are no findings suspicious for malignancy. The images were
evaluated with computer-aided detection.
IMPRESSION: No mammographic evidence of malignancy. A result letter of this
screening mammogram will be mailed directly to the patient.

RECOMMENDATION:
Screening mammogram in one year. (Code:T4-5-GWO)

BI-RADS CATEGORY  1: Negative.

## 2022-04-16 ENCOUNTER — Ambulatory Visit: Payer: Federal, State, Local not specified - PPO | Admitting: Podiatry

## 2022-04-16 ENCOUNTER — Encounter: Payer: Self-pay | Admitting: Podiatry

## 2022-04-16 ENCOUNTER — Ambulatory Visit (INDEPENDENT_AMBULATORY_CARE_PROVIDER_SITE_OTHER): Payer: Federal, State, Local not specified - PPO

## 2022-04-16 DIAGNOSIS — Z79899 Other long term (current) drug therapy: Secondary | ICD-10-CM | POA: Insufficient documentation

## 2022-04-16 DIAGNOSIS — M722 Plantar fascial fibromatosis: Secondary | ICD-10-CM | POA: Diagnosis not present

## 2022-04-16 MED ORDER — METHYLPREDNISOLONE 4 MG PO TBPK: ORAL_TABLET | ORAL | 0 refills | Status: DC

## 2022-04-16 MED ORDER — MELOXICAM 15 MG PO TABS: 15.0000 mg | ORAL_TABLET | Freq: Every day | ORAL | 3 refills | Status: DC

## 2022-04-16 MED ORDER — TRIAMCINOLONE ACETONIDE 40 MG/ML IJ SUSP
20.0000 mg | Freq: Once | INTRAMUSCULAR | Status: AC
  Administered 2022-04-16: 20 mg

## 2022-04-16 NOTE — Progress Notes (Signed)
Subjective:  Patient ID: Diane Morales, female    DOB: Aug 07, 1970,  MRN: [id number] HPI Chief Complaint  Patient presents with   Foot Pain    Plantar heel left - aching x 6 months, AM pain, history PF right-went to PT-got better, tried PT for left foot, but hasn't helped, took celebrex but increase liver enzymes, PCP stopped   New Patient (Initial Visit)    51 y.o. female presents with the above complaint.   ROS: Denies fever chills nausea vomit muscle aches pains calf pain back pain chest pain shortness of breath.  Continues to go to PT  Past Medical History:  Diagnosis Date   HTN (hypertension)    Past Surgical History:  Procedure Laterality Date   NO PAST SURGERIES      Current Outpatient Medications:    ketorolac (TORADOL) 10 MG tablet, Take 10 mg by mouth every 6 (six) hours as needed., Disp: , Rfl:    meloxicam (MOBIC) 15 MG tablet, Take 1 tablet (15 mg total) by mouth daily., Disp: 30 tablet, Rfl: 3   methylPREDNISolone (MEDROL DOSEPAK) 4 MG TBPK tablet, 6 day dose pack - take as directed, Disp: 21 tablet, Rfl: 0   buPROPion (WELLBUTRIN XL) 150 MG 24 hr tablet, Take 150 mg by mouth every morning., Disp: , Rfl:    Calcium Carbonate-Vit D-Min (CALCIUM 1200 PO), Take by mouth daily., Disp: , Rfl:    celecoxib (CELEBREX) 100 MG capsule, Take 100 mg by mouth daily. Mondays, Wednesdays, and Fridays, Disp: , Rfl:    Cholecalciferol (D3 PO), Take by mouth daily., Disp: , Rfl:    hydrochlorothiazide (HYDRODIURIL) 25 MG tablet, Take 25 mg by mouth daily., Disp: , Rfl:    levothyroxine (SYNTHROID) 150 MCG tablet, Take 125 mcg by mouth daily before breakfast., Disp: , Rfl:    MAGNESIUM PO, Take by mouth daily., Disp: , Rfl:    metoprolol succinate (TOPROL-XL) 25 MG 24 hr tablet, Take 0.5 tablets (12.5 mg total) by mouth daily., Disp: 45 tablet, Rfl: 3   Multiple Vitamins-Minerals (ZINC PO), Take 1 tablet by mouth daily., Disp: , Rfl:    olmesartan (BENICAR) 20 MG tablet, Take 20 mg  by mouth daily., Disp: , Rfl:    pantoprazole (PROTONIX) 40 MG tablet, Take 1 tablet by mouth daily., Disp: , Rfl:    rosuvastatin (CRESTOR) 10 MG tablet, Take 1 tablet (10 mg total) by mouth daily., Disp: 90 tablet, Rfl: 1   Turmeric 500 MG CAPS, Take by mouth 2 (two) times daily., Disp: , Rfl:   Allergies  Allergen Reactions   Gluten Meal     Other reaction(s): Other (See Comments) Celiac Celiac    Review of Systems Objective:  There were no vitals filed for this visit.  General: Well developed, nourished, in no acute distress, alert and oriented x3   Dermatological: Skin is warm, dry and supple bilateral. Nails x 10 are well maintained; remaining integument appears unremarkable at this time. There are no open sores, no preulcerative lesions, no rash or signs of infection present.  Vascular: Dorsalis Pedis artery and Posterior Tibial artery pedal pulses are 2/4 bilateral with immedate capillary fill time. Pedal hair growth present. No varicosities and no lower extremity edema present bilateral.   Neruologic: Grossly intact via light touch bilateral. Vibratory intact via tuning fork bilateral. Protective threshold with Semmes Wienstein monofilament intact to all pedal sites bilateral. Patellar and Achilles deep tendon reflexes 2+ bilateral. No Babinski or clonus noted bilateral.   Musculoskeletal: No  gross boney pedal deformities bilateral. No pain, crepitus, or limitation noted with foot and ankle range of motion bilateral. Muscular strength 5/5 in all groups tested bilateral.  She has pain on palpation medial calcaneal tubercle of the left heel.  No pain on medial-lateral compression of the calcaneus.  She does have some tenderness on direct palpation plantar aspect of the tubercle.  Gait: Unassisted, Nonantalgic.    Radiographs:  Radiographs taken today demonstrate a soft tissue increase in density at the plantar fascial Caney insertion site.  No significant osseous abnormalities  otherwise.  Assessment & Plan:   Assessment: Planter fasciitis left foot.  Plan: Discussed etiology pathology conservative versus surgical therapies.  At this point were going to try methylprednisolone to be followed by meloxicam.  If the meloxicam makes her feel bad she will stop it immediately.  We also injected the area today 20 mg Kenalog 5 mg Marcaine to the point of maximal tenderness.  Put her in a plantar fascia brace and discussed appropriate shoe gear with her.  Would like to follow-up with her in about 1 month to 6 weeks.     Porcha Deblanc T. [place], [place]

## 2022-05-10 ENCOUNTER — Other Ambulatory Visit: Payer: Self-pay

## 2022-05-10 DIAGNOSIS — E785 Hyperlipidemia, unspecified: Secondary | ICD-10-CM

## 2022-05-10 DIAGNOSIS — Z8249 Family history of ischemic heart disease and other diseases of the circulatory system: Secondary | ICD-10-CM

## 2022-05-10 DIAGNOSIS — Z79899 Other long term (current) drug therapy: Secondary | ICD-10-CM

## 2022-05-10 MED ORDER — ROSUVASTATIN CALCIUM 10 MG PO TABS: 10.0000 mg | ORAL_TABLET | Freq: Every day | ORAL | 2 refills | Status: DC

## 2022-05-31 ENCOUNTER — Ambulatory Visit: Payer: Federal, State, Local not specified - PPO | Admitting: Podiatry

## 2022-05-31 DIAGNOSIS — M722 Plantar fascial fibromatosis: Secondary | ICD-10-CM | POA: Diagnosis not present

## 2022-06-02 IMAGING — DX DG CHEST 1V PORT
1 series · 1 of 1 positions shown · non-contrast
Comparison: CT 04/30/2019

CLINICAL DATA: Dyspnea

EXAM:
PORTABLE CHEST 1 VIEW

[chest]
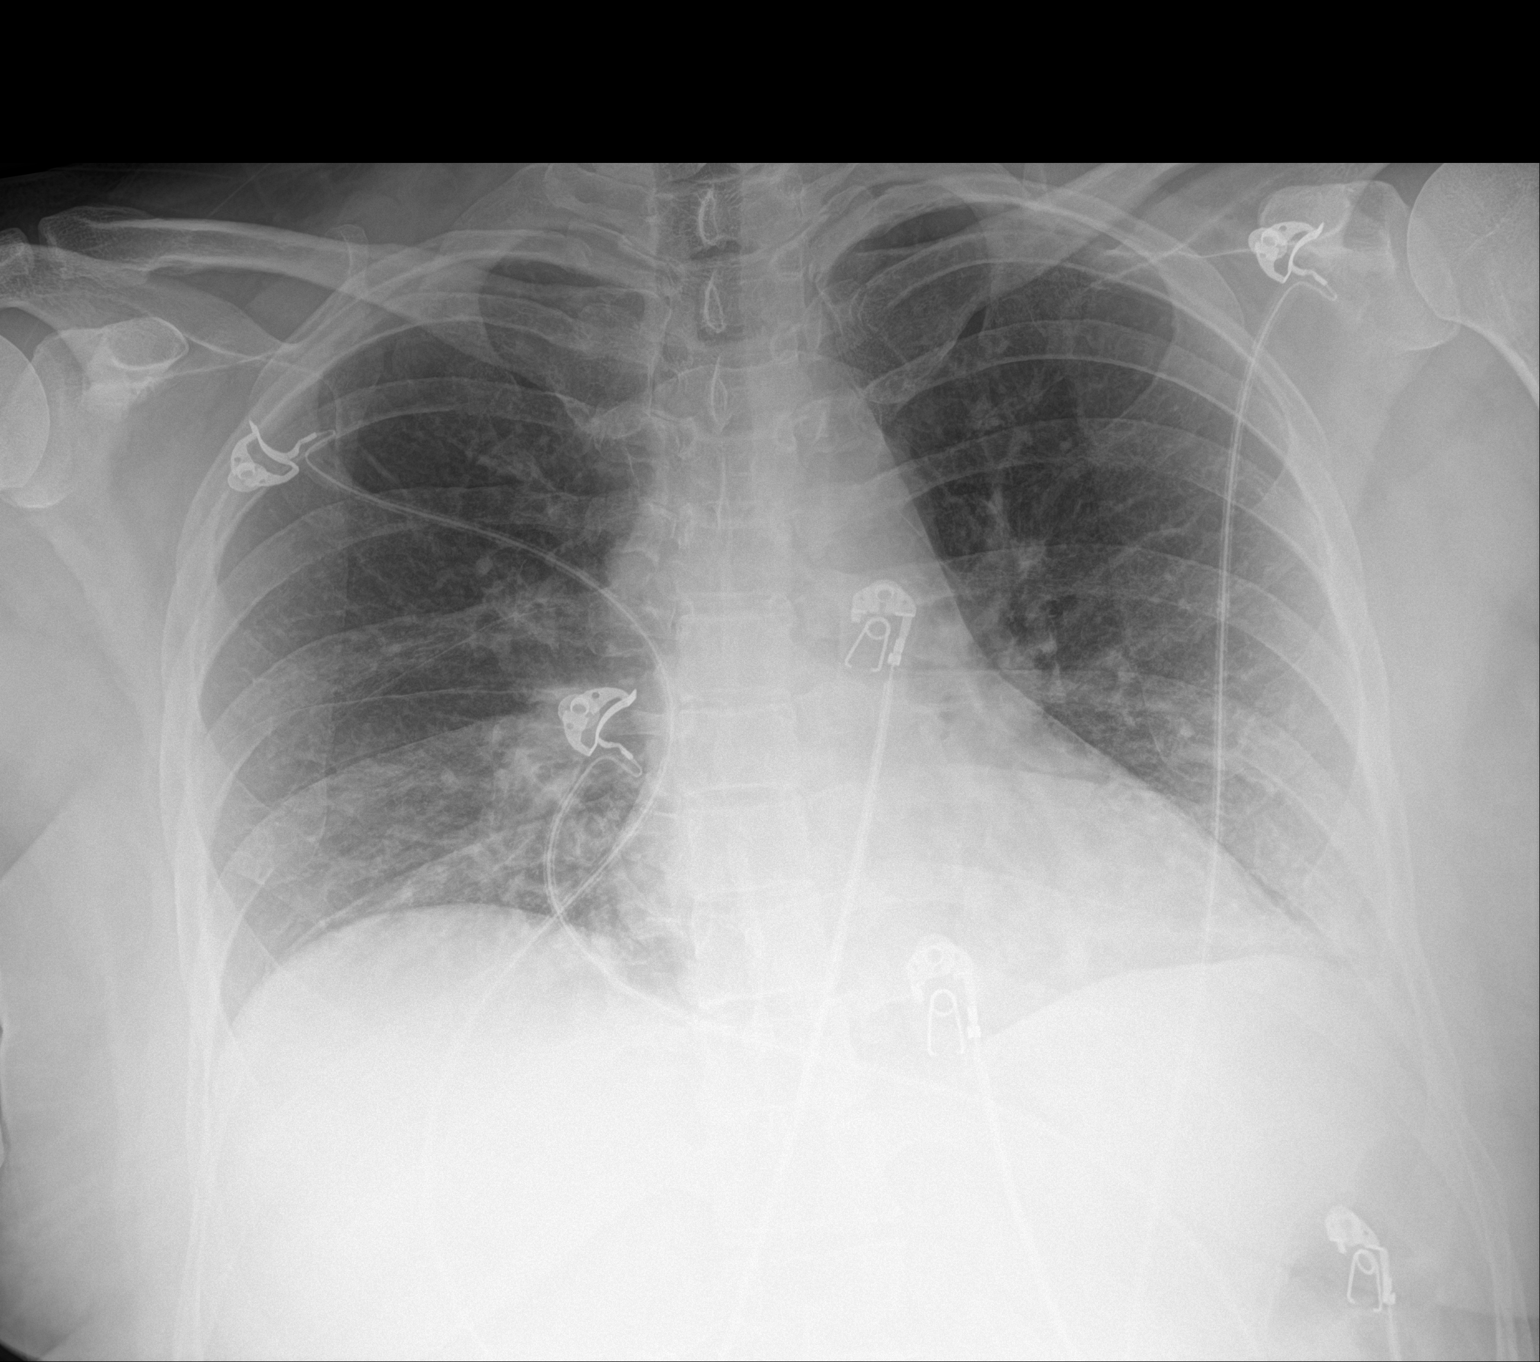

[1 of 1 positions shown; findings below may reference images not displayed]

FINDINGS: The heart size and mediastinal contours are within normal limits.
Both lungs are clear. The visualized skeletal structures are
unremarkable.
IMPRESSION: No active disease.

## 2022-06-02 IMAGING — CT CT ANGIO CHEST
4 of 7 series · 16 of 36 positions shown · IV contrast (omnipaque)
Comparison: Chest CT 04/30/2019

CLINICAL DATA: Pleuritic chest pain.  Elevated D-dimer level.

EXAM:
CT ANGIOGRAPHY CHEST WITH CONTRAST
TECHNIQUE: Multidetector CT imaging of the chest was performed using the
standard protocol during bolus administration of intravenous
contrast. Multiplanar CT image reconstructions and MIPs were
obtained to evaluate the vascular anatomy.
CONTRAST:  100mL OMNIPAQUE IOHEXOL 350 MG/ML SOLN

[Series 5: pe 2mm · axial · 0.72mm/px · z∈[-232,-44]mm · 4 of 158 slices shown]
[im 32/158  lung]
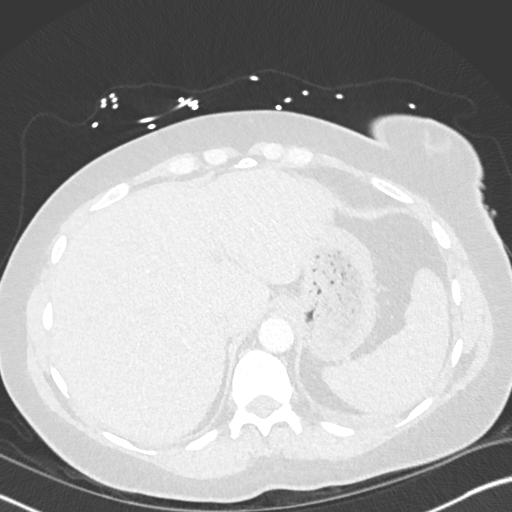
[im 63/158  mediastinal]
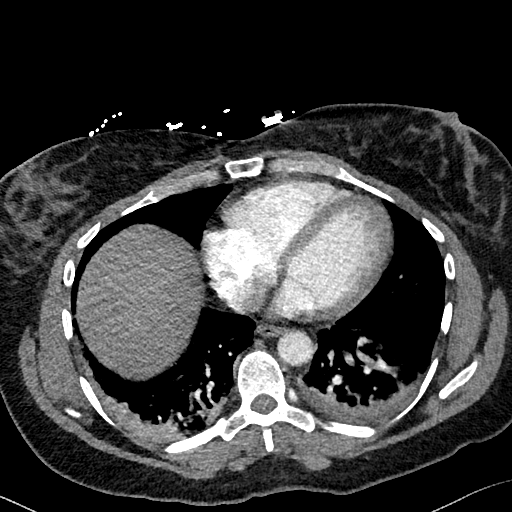
[im 95/158  lung]
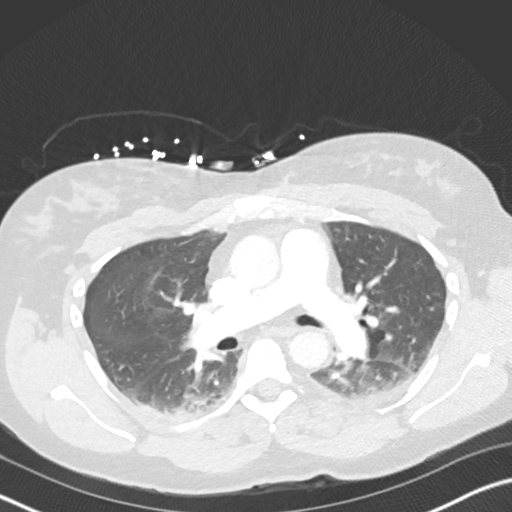
[im 126/158  mediastinal]
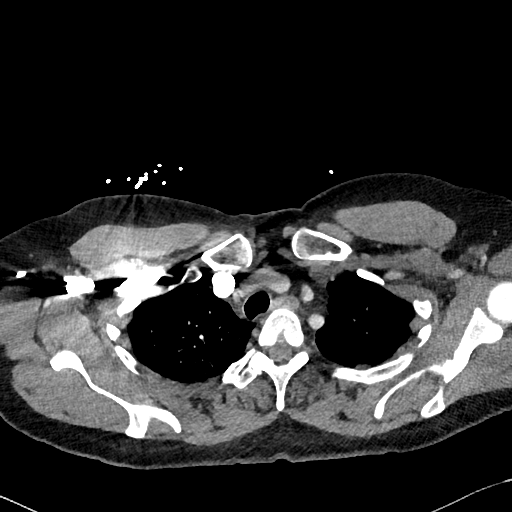

[Series 6: pe lung · axial · 0.72mm/px · z∈[-168,-44]mm · 3 of 126 slices shown]
[im 32/126  mediastinal]
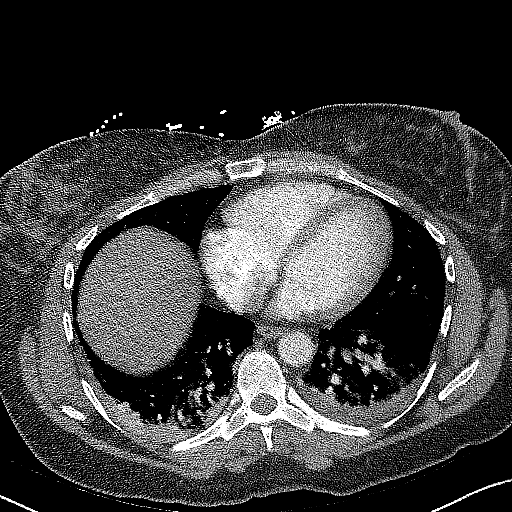
[im 63/126  mediastinal]
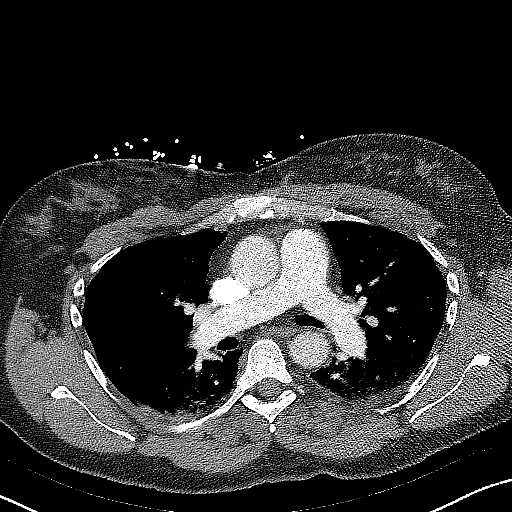
[im 94/126  mediastinal]
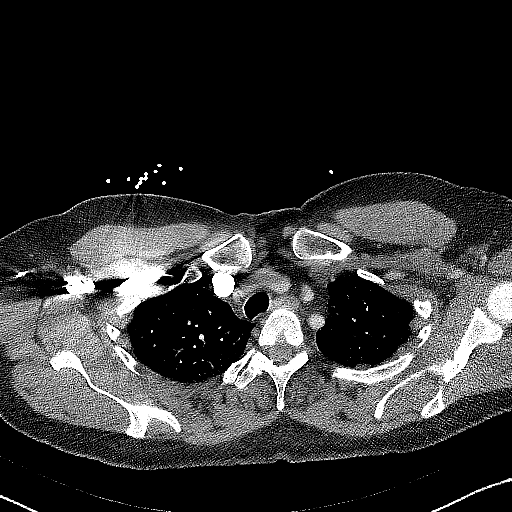

[Series 7: pe thins · axial · 0.72mm/px · z∈[-251,+2]mm · 8 of 413 slices shown]
[im 26/413  lung]
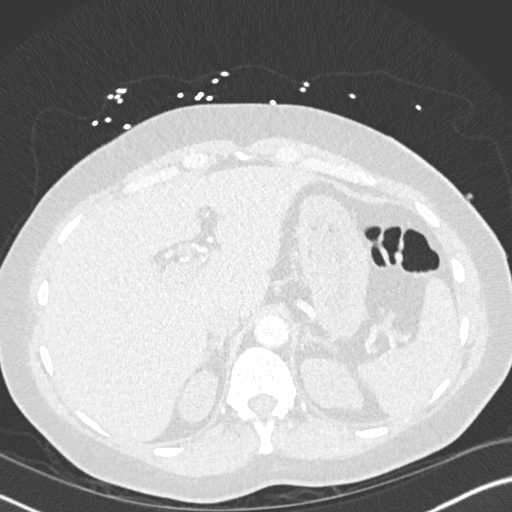
[im 78/413  lung]
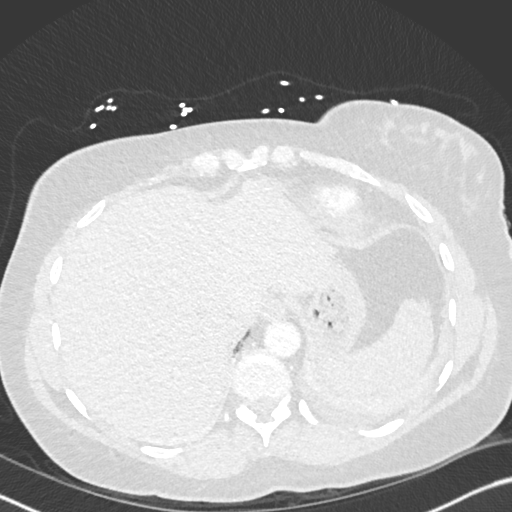
[im 129/413  lung]
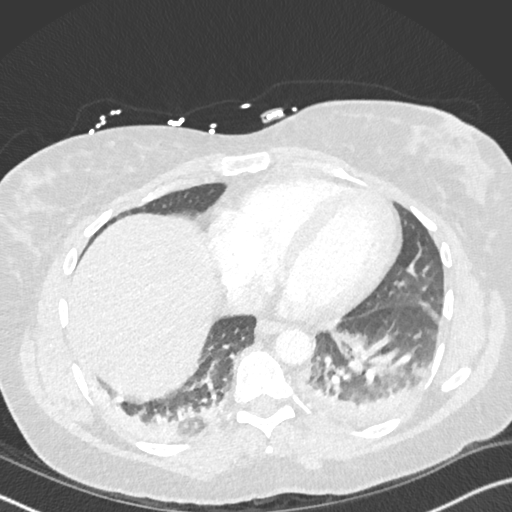
[im 181/413  lung]
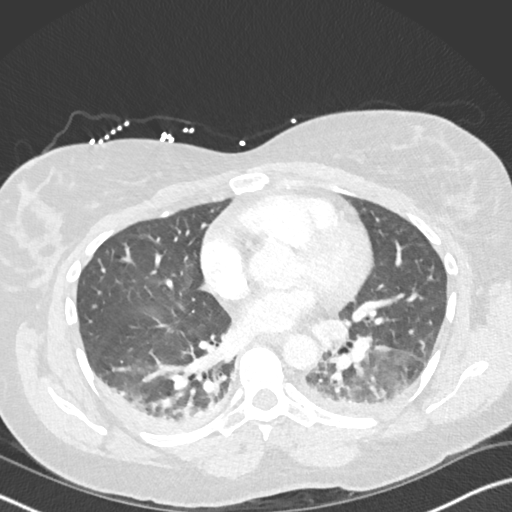
[im 232/413  lung]
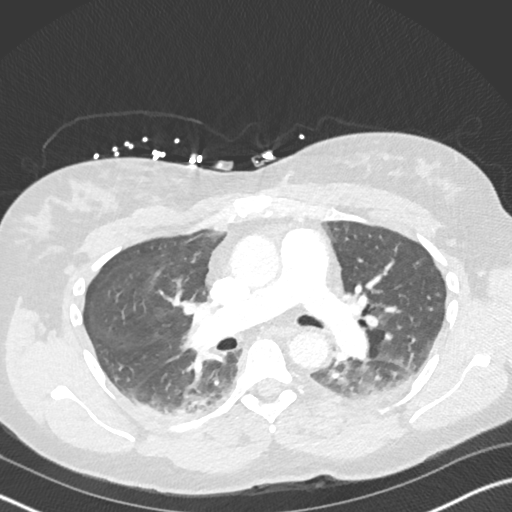
[im 284/413  lung]
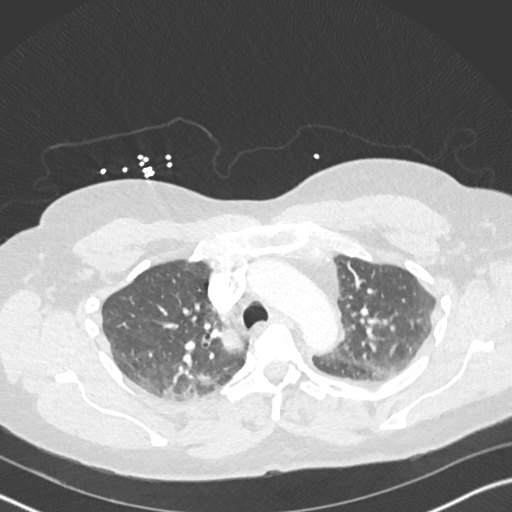
[im 335/413  lung]
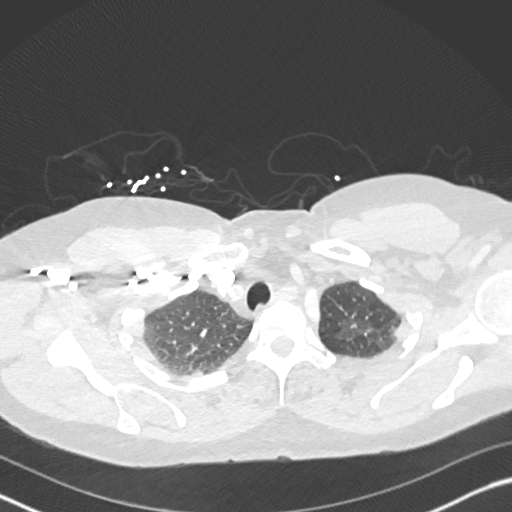
[im 387/413  lung]
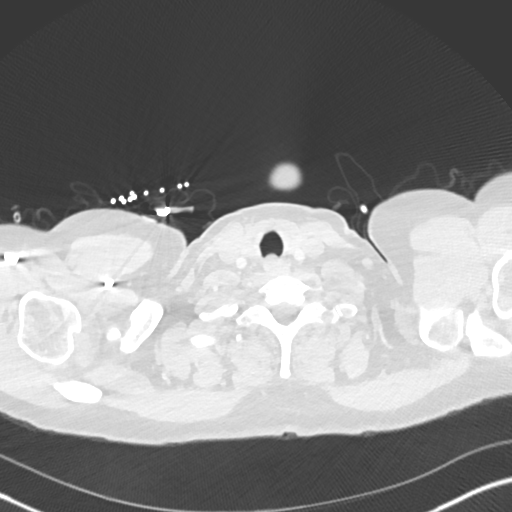

[Series 8: pe 2mm cor · coronal · 0.67mm/px · 1 of 123 slices shown]
[im 62/123  mediastinal]
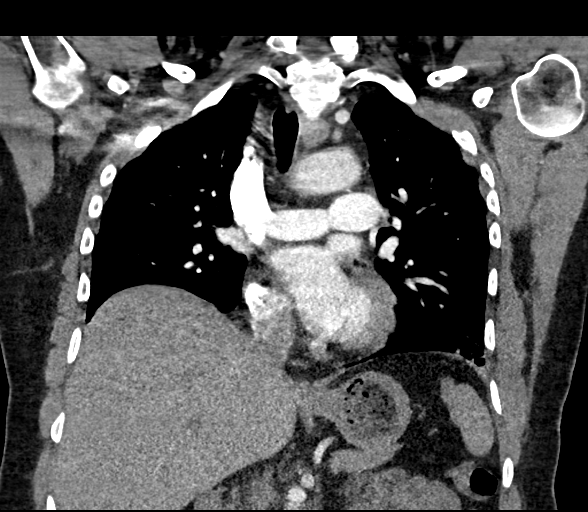

[16 of 36 positions shown; findings below may reference images not displayed]

FINDINGS: Cardiovascular: Accounting for motion artifact, no filling defect is
identified in the pulmonary arterial tree to suggest pulmonary
embolus. No acute vascular findings.

Mediastinum/Nodes: Unremarkable

Lungs/Pleura: Symmetric dependent atelectasis in both lower lobes.
Mosaic attenuation is noted at the lung apices with some reduced
conspicuity of vascular structures in the darker segments suggesting
air trapping/small airways disease.

Upper Abdomen: Cholecystectomy.

Musculoskeletal: Unremarkable

Review of the MIP images confirms the above findings.
IMPRESSION: 1. No filling defect is identified in the pulmonary arterial tree to
suggest pulmonary embolus.
2. Symmetric dependent atelectasis in both lower lobes the.
3. Air trapping in the lung apices suggesting small airways
disease/bronchiolitis.

## 2022-06-03 NOTE — Progress Notes (Signed)
She presents today for follow-up of her Planter fasciitis of her left heel.  She states that is wonderful feels great no problems whatsoever.  Objective: Vital signs stable alert oriented x 3.  Pulses are palpable.  He has no reproducible pain on palpation medial calcaneal tubercle of her left foot.  Assessment: Well-healing Planter fasciitis.  Plan: I recommended she continue all conservative therapies at this point until he has surpassed 1 month of being pain-free after this date.  I would like to follow-up with her on an as-needed basis or with any regression.

## 2022-07-26 ENCOUNTER — Other Ambulatory Visit: Payer: Self-pay | Admitting: Obstetrics and Gynecology

## 2022-07-26 DIAGNOSIS — Z1231 Encounter for screening mammogram for malignant neoplasm of breast: Secondary | ICD-10-CM

## 2022-08-16 ENCOUNTER — Other Ambulatory Visit: Payer: Self-pay | Admitting: Podiatry

## 2022-10-16 ENCOUNTER — Ambulatory Visit: Payer: Federal, State, Local not specified - PPO | Admitting: Podiatry

## 2022-10-16 DIAGNOSIS — M722 Plantar fascial fibromatosis: Secondary | ICD-10-CM | POA: Diagnosis not present

## 2022-10-16 MED ORDER — TRIAMCINOLONE ACETONIDE 40 MG/ML IJ SUSP
40.0000 mg | Freq: Once | INTRAMUSCULAR | Status: AC
  Administered 2022-10-16: 40 mg

## 2022-10-17 NOTE — Progress Notes (Signed)
She presents today chief complaint of recurrence of her Planter fasciitis states that he was doing well for quite some time.  She states that both of them are hurting now.  Objective: Vital signs are stable alert and oriented x 3.  Pulses are palpable.  She has pain on palpation medial calcaneal tubercles bilateral right appears to be worse than the left.  Assessment: Recurrence of Planter fasciitis right greater than left.  Plan: Reinjected bilateral heels today.  I think for the long-term getting her into a set of orthotics will be best.  We will schedule that.

## 2022-10-23 ENCOUNTER — Other Ambulatory Visit: Payer: Self-pay | Admitting: Podiatry

## 2022-10-24 ENCOUNTER — Ambulatory Visit
Admission: RE | Admit: 2022-10-24 | Discharge: 2022-10-24 | Disposition: A | Discharge status: Home / Self Care | Payer: Federal, State, Local not specified - PPO | Source: Ambulatory Visit | Attending: Obstetrics and Gynecology | Admitting: Obstetrics and Gynecology

## 2022-10-24 DIAGNOSIS — Z1231 Encounter for screening mammogram for malignant neoplasm of breast: Secondary | ICD-10-CM

## 2022-11-07 ENCOUNTER — Other Ambulatory Visit: Payer: Federal, State, Local not specified - PPO

## 2022-11-08 ENCOUNTER — Ambulatory Visit (INDEPENDENT_AMBULATORY_CARE_PROVIDER_SITE_OTHER): Payer: Federal, State, Local not specified - PPO | Admitting: Podiatry

## 2022-11-08 DIAGNOSIS — M722 Plantar fascial fibromatosis: Secondary | ICD-10-CM

## 2022-11-08 NOTE — Progress Notes (Signed)
Patient presents today to casted for custom orthotics.  Patient was scanned by [employer] scanner 1 pair of custom orthotics   Ht 6'1 Wt  230lb  shoe size 11.51men regular  Hold casting until checking with insurance company

## 2022-12-17 ENCOUNTER — Ambulatory Visit (INDEPENDENT_AMBULATORY_CARE_PROVIDER_SITE_OTHER): Payer: Federal, State, Local not specified - PPO

## 2022-12-17 DIAGNOSIS — M722 Plantar fascial fibromatosis: Secondary | ICD-10-CM | POA: Diagnosis not present

## 2022-12-17 NOTE — Progress Notes (Signed)
Patient presents today to pick up custom molded foot orthotics, diagnosed with PF by Dr. Al Corpus .   Orthotics were dispensed and fit was satisfactory. Reviewed instructions for break-in and wear. Written instructions given to patient.  Patient will follow up as needed.   Addison Bailey Cped, CFo, CFm

## 2022-12-26 ENCOUNTER — Other Ambulatory Visit: Payer: Self-pay | Admitting: Podiatry

## 2023-01-07 ENCOUNTER — Other Ambulatory Visit: Payer: Self-pay

## 2023-01-07 DIAGNOSIS — E785 Hyperlipidemia, unspecified: Secondary | ICD-10-CM

## 2023-01-07 DIAGNOSIS — Z8249 Family history of ischemic heart disease and other diseases of the circulatory system: Secondary | ICD-10-CM

## 2023-01-07 DIAGNOSIS — Z79899 Other long term (current) drug therapy: Secondary | ICD-10-CM

## 2023-01-07 MED ORDER — ROSUVASTATIN CALCIUM 10 MG PO TABS: 10.0000 mg | ORAL_TABLET | Freq: Every day | ORAL | 0 refills | Status: DC

## 2023-01-13 ENCOUNTER — Other Ambulatory Visit: Payer: Self-pay | Admitting: Nurse Practitioner

## 2023-02-08 ENCOUNTER — Other Ambulatory Visit (HOSPITAL_BASED_OUTPATIENT_CLINIC_OR_DEPARTMENT_OTHER): Payer: Self-pay | Admitting: Physician Assistant

## 2023-02-08 ENCOUNTER — Ambulatory Visit (HOSPITAL_BASED_OUTPATIENT_CLINIC_OR_DEPARTMENT_OTHER)
Admission: RE | Admit: 2023-02-08 | Discharge: 2023-02-08 | Disposition: A | Discharge status: Home / Self Care | Payer: Federal, State, Local not specified - PPO | Source: Ambulatory Visit | Attending: Physician Assistant | Admitting: Physician Assistant

## 2023-02-08 DIAGNOSIS — R748 Abnormal levels of other serum enzymes: Secondary | ICD-10-CM | POA: Insufficient documentation

## 2023-02-08 MED ORDER — IOHEXOL 300 MG/ML  SOLN
80.0000 mL | Freq: Once | INTRAMUSCULAR | Status: AC | PRN
  Administered 2023-02-08: 80 mL via INTRAVENOUS

## 2023-03-12 ENCOUNTER — Other Ambulatory Visit: Payer: Self-pay

## 2023-03-12 MED ORDER — MELOXICAM 15 MG PO TABS: 15.0000 mg | ORAL_TABLET | Freq: Every day | ORAL | 1 refills | Status: DC

## 2023-04-03 ENCOUNTER — Other Ambulatory Visit: Payer: Self-pay | Admitting: Nurse Practitioner

## 2023-04-03 DIAGNOSIS — E785 Hyperlipidemia, unspecified: Secondary | ICD-10-CM

## 2023-04-03 DIAGNOSIS — Z8249 Family history of ischemic heart disease and other diseases of the circulatory system: Secondary | ICD-10-CM

## 2023-04-03 DIAGNOSIS — Z79899 Other long term (current) drug therapy: Secondary | ICD-10-CM

## 2023-04-16 ENCOUNTER — Other Ambulatory Visit: Payer: Self-pay | Admitting: Nurse Practitioner

## 2023-04-17 ENCOUNTER — Other Ambulatory Visit: Payer: Self-pay | Admitting: Nurse Practitioner

## 2023-04-17 DIAGNOSIS — Z8249 Family history of ischemic heart disease and other diseases of the circulatory system: Secondary | ICD-10-CM

## 2023-04-17 DIAGNOSIS — E785 Hyperlipidemia, unspecified: Secondary | ICD-10-CM

## 2023-04-17 DIAGNOSIS — Z79899 Other long term (current) drug therapy: Secondary | ICD-10-CM

## 2023-05-11 ENCOUNTER — Other Ambulatory Visit: Payer: Self-pay | Admitting: Nurse Practitioner

## 2023-05-15 ENCOUNTER — Other Ambulatory Visit: Payer: Self-pay | Admitting: Nurse Practitioner

## 2023-05-20 ENCOUNTER — Other Ambulatory Visit: Payer: Self-pay

## 2023-05-20 ENCOUNTER — Other Ambulatory Visit: Payer: Self-pay | Admitting: Nurse Practitioner

## 2023-05-20 ENCOUNTER — Other Ambulatory Visit (HOSPITAL_BASED_OUTPATIENT_CLINIC_OR_DEPARTMENT_OTHER): Payer: Self-pay

## 2023-05-20 MED ORDER — LEVOTHYROXINE SODIUM 137 MCG PO TABS
137.0000 ug | ORAL_TABLET | Freq: Every morning | ORAL | 1 refills | Status: DC
  Filled 2023-05-20: qty 90, 90d supply, fill #0

## 2023-05-20 MED ORDER — OLMESARTAN MEDOXOMIL 20 MG PO TABS
20.0000 mg | ORAL_TABLET | Freq: Every day | ORAL | 1 refills | Status: DC
  Filled 2023-11-11: qty 90, 90d supply, fill #0

## 2023-05-20 MED ORDER — MEDROXYPROGESTERONE ACETATE 10 MG PO TABS: 10.0000 mg | ORAL_TABLET | Freq: Every day | ORAL | 6 refills | Status: DC | PRN

## 2023-05-20 MED ORDER — PANTOPRAZOLE SODIUM 40 MG PO TBEC
40.0000 mg | DELAYED_RELEASE_TABLET | Freq: Every day | ORAL | 1 refills | Status: DC
  Filled 2023-11-11: qty 90, 90d supply, fill #0

## 2023-05-20 MED ORDER — HYDROCHLOROTHIAZIDE 25 MG PO TABS
25.0000 mg | ORAL_TABLET | Freq: Every morning | ORAL | 1 refills | Status: DC
  Filled 2023-11-11: qty 90, 90d supply, fill #0

## 2023-07-02 ENCOUNTER — Ambulatory Visit (INDEPENDENT_AMBULATORY_CARE_PROVIDER_SITE_OTHER): Payer: Federal, State, Local not specified - PPO

## 2023-07-02 ENCOUNTER — Other Ambulatory Visit (HOSPITAL_BASED_OUTPATIENT_CLINIC_OR_DEPARTMENT_OTHER): Payer: Self-pay

## 2023-07-02 ENCOUNTER — Ambulatory Visit: Payer: Federal, State, Local not specified - PPO | Admitting: Podiatry

## 2023-07-02 ENCOUNTER — Encounter: Payer: Self-pay | Admitting: Podiatry

## 2023-07-02 DIAGNOSIS — M722 Plantar fascial fibromatosis: Secondary | ICD-10-CM

## 2023-07-02 MED ORDER — PREDNISOLONE ACETATE 1 % OP SUSP
1.0000 [drp] | Freq: Four times a day (QID) | OPHTHALMIC | 0 refills | Status: DC
  Filled 2023-07-02: qty 5, 25d supply, fill #0

## 2023-07-02 NOTE — Progress Notes (Signed)
 Subjective: Chief Complaint  Patient presents with   Plantar Fasciitis    RM#13 Right foot pain being treated for plantar fasciitis wanting to know next step of treatment. Pain scale today is a 85.    53 year old female presents the office with above concerns.  She states that she still has ongoing pain mostly to the right heel.  She did have a steroid injection by her primary care doctor.  She is also previously tried physical therapy as well as orthotics but she is continue get pain to the heel.  No recent injury or changes otherwise.  Objective: AAO x3, NAD DP/PT pulses palpable bilaterally, CRT less than 3 seconds There is tenderness palpation along the plantar medial tubercle of the calcaneus at the insertion of plantar fascia on the right foot.  There is no pain with lateral compression of calcaneus.  There is no edema, erythema.  No pain Achilles tendon.  Negative sign. No pain with calf compression, swelling, warmth, erythema  Assessment: Right chronic heel pain, plantar fasciitis  Plan: -All treatment options discussed with the patient including all alternatives, risks, complications.  -X-rays obtained reviewed the right foot.  No evidence of acute fracture.  Posterior calcaneal spurring is noted. -Given ongoing nature of her symptoms I recommended advanced imaging, MRI which is ordered today.  Meantime continue with stretching, icing as well as shoes, support.  Discussed other treatment options including EPAT, PRP or even surgery if needed. -Patient encouraged to call the office with any questions, concerns, change in symptoms.   Diane Morales DPM

## 2023-07-12 ENCOUNTER — Ambulatory Visit (HOSPITAL_BASED_OUTPATIENT_CLINIC_OR_DEPARTMENT_OTHER)
Admission: RE | Admit: 2023-07-12 | Discharge: 2023-07-12 | Disposition: A | Discharge status: Home / Self Care | Payer: Federal, State, Local not specified - PPO | Source: Ambulatory Visit | Attending: Podiatry | Admitting: Podiatry

## 2023-07-12 ENCOUNTER — Encounter (HOSPITAL_BASED_OUTPATIENT_CLINIC_OR_DEPARTMENT_OTHER): Payer: Self-pay

## 2023-07-12 DIAGNOSIS — M722 Plantar fascial fibromatosis: Secondary | ICD-10-CM | POA: Diagnosis not present

## 2023-07-12 DIAGNOSIS — M7731 Calcaneal spur, right foot: Secondary | ICD-10-CM | POA: Diagnosis not present

## 2023-07-12 DIAGNOSIS — R600 Localized edema: Secondary | ICD-10-CM | POA: Diagnosis not present

## 2023-07-12 DIAGNOSIS — M25571 Pain in right ankle and joints of right foot: Secondary | ICD-10-CM | POA: Insufficient documentation

## 2023-07-12 DIAGNOSIS — M65971 Unspecified synovitis and tenosynovitis, right ankle and foot: Secondary | ICD-10-CM | POA: Insufficient documentation

## 2023-07-16 ENCOUNTER — Encounter: Payer: Self-pay | Admitting: Podiatry

## 2023-07-24 ENCOUNTER — Other Ambulatory Visit: Payer: Self-pay | Admitting: Obstetrics and Gynecology

## 2023-07-24 ENCOUNTER — Other Ambulatory Visit: Payer: Self-pay | Admitting: Podiatry

## 2023-07-24 DIAGNOSIS — M722 Plantar fascial fibromatosis: Secondary | ICD-10-CM

## 2023-07-24 DIAGNOSIS — Z1231 Encounter for screening mammogram for malignant neoplasm of breast: Secondary | ICD-10-CM

## 2023-07-26 NOTE — Telephone Encounter (Signed)
 I called patient to review questions. She is going to continue with PT for now

## 2023-07-30 NOTE — Therapy (Signed)
 OUTPATIENT PHYSICAL THERAPY LOWER EXTREMITY EVALUATION   Patient Name: Diane Morales MRN: [id number] DOB:05-10-1971, 53 y.o., female Today's Date: 08/01/2023  END OF SESSION:  PT End of Session - 07/31/23 0858     Visit Number 1    Number of Visits 12    Date for PT Re-Evaluation 09/11/23    Authorization Type BCBS    PT Start Time 0806    PT Stop Time 0857    PT Time Calculation (min) 51 min    Activity Tolerance Patient tolerated treatment well    Behavior During Therapy [facility] for tasks assessed/performed             Past Medical History:  Diagnosis Date   HTN (hypertension)    Past Surgical History:  Procedure Laterality Date   CESAREAN SECTION  2006   CHOLECYSTECTOMY  05/16/2017   Patient Active Problem List   Diagnosis Date Noted   Other long term (current) drug therapy 04/16/2022   Plantar fasciitis of right foot 03/05/2022   Rotator cuff tendonitis, right 03/05/2022   Umbilical hernia 03/05/2022   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   Laryngopharyngeal reflux (LPR) 03/03/2019   Neck pain 03/03/2019   Referred otalgia of right ear 03/03/2019   Shortness of breath 03/03/2019   Essential hypertension 12/09/2018   GERD (gastroesophageal reflux disease) 12/09/2018   Hyperlipidemia 12/09/2018   Chronic constipation 01/28/2018   Overweight (BMI 25.0-29.9) 01/28/2018   S/P laparoscopic cholecystectomy 05/16/2017   Celiac disease 08/08/2011   Depression 08/08/2011   Hypothyroid 06/27/2011    PCP: Julien Girt, PA-C  REFERRING PROVIDER: Vivi Barrack, DPM   REFERRING DIAG: M72.2 (ICD-10-CM) - Plantar fasciitis   THERAPY DIAG:  Pain in right foot  Difficulty in walking, not elsewhere classified  Rationale for Evaluation and Treatment: Rehabilitation  ONSET DATE: 3 year hx of pain / 07/24/23 PT order  SUBJECTIVE:   SUBJECTIVE STATEMENT: Pt has had plantar foot pain for 3 years.  Pt has had 3 injections which provide  relief.  Pt has had PT twice which does help.  She uses orthotics.  Pt has tried different shoes though feels the best with [employer] shoes.  She uses ice some, but has increased pain after using ice.  Pt uses a splint at night occasionally. Pt tried shockwave therapy for 1 visit though had increased pain.  Pt states she gets better and gets worse.        Pt had x rays and a MRI.   Her pain is worst 1st thing in AM.  She has pain with the initial step after sitting awhile.  She has increased pain if she has been on her feet a lot.  Pt is limited with ambulation and activity due to pain.  She alters her gait due to pain.  She reports having back pain which she thinks is from altering her gait due to compensation.     PERTINENT HISTORY: Chronic plantar foot pain HTN Celiac disease  PAIN:  Are you having pain? Yes NPRS:  3/10 current, 8/10 worst, 1-2/10 best Location:  t/o R heel including inf, post, med, and lat though worse at inferior heel Type:  stabbing, dull ache Easing factors:  orthotics, heating pad at night, stretching Aggravating Factors:  prolonged standing, worst pain 1st thing in AM, ambulation  PRECAUTIONS: None    WEIGHT BEARING RESTRICTIONS: No  FALLS:  Has patient fallen in last 6 months? No  LIVING ENVIRONMENT: Lives with: lives  with their family Lives in: 2 story home Stairs: yes   OCCUPATION: Pharmacist with [employer]  PLOF: Independent  PATIENT GOALS:  to be able to perform activities, decrease pain   OBJECTIVE:  Note: Objective measures were completed at Evaluation unless otherwise noted.  DIAGNOSTIC FINDINGS:  R ankle MRI on 07/12/23: IMPRESSION: 1. Plantar fasciitis, with thickening of the medial band of the plantar fascia, surrounding edema, and edema in the adjacent plantar calcaneal spur. 2. Common peroneus tendon sheath tenosynovitis. 3. Distal tibialis posterior tenosynovitis and tendinopathy. 4. Trace edema in the upper portion of  Kager's fat pad but the adjacent Achilles tendon appears normal.  X rays of R foot per MD note: No evidence of acute fracture. Posterior calcaneal spurring is noted.   PATIENT SURVEYS:  LEFS 57/80  COGNITION: Overall cognitive status: Within functional limits for tasks assessed     PALPATION: Minimal tenderness in plantar fascia of R foot, none on L.   Pt has soft tissue tightness in plantar fascia especially in medial plantar fascia.   LOWER EXTREMITY ROM:  Active ROM Right eval Left eval  Hip flexion    Hip extension    Hip abduction    Hip adduction    Hip internal rotation    Hip external rotation    Knee flexion    Knee extension    Ankle dorsiflexion 17 18  Ankle plantarflexion 69 70  Ankle inversion 38 39  Ankle eversion 14 20   (Blank rows = not tested)  LOWER EXTREMITY MMT:  MMT Right eval Left eval  Hip flexion    Hip extension    Hip abduction    Hip adduction    Hip internal rotation    Hip external rotation    Knee flexion    Knee extension    Ankle dorsiflexion 5/5 5/5  Ankle plantarflexion WFL seated WFL seated  Ankle inversion 5/5 5/5  Ankle eversion 5/5 5/5   (Blank rows = not tested)   GAIT: Assistive device utilized: None Level of assistance: Complete Independence Comments: Pt ambulates with a normalized heel to toe gait without limping.                                                                                                                                  TREATMENT DATE:    Pt performed plantar fascia stretch 3x20-30 seconds.  PT educated pt in using a lacrosse ball to roll under plantar fascia for improved soft tissue mobility.  Educated pt to not walk barefooted.  Pt received a HEP handout and was educated in correct form and appropriate frequency.   PATIENT EDUCATION:  Education details: HEP, POC, dx, rationale of interventions, objective findings, relevant anatomy, and what to expect next Rx.   Person educated:  Patient Education method: Explanation, Demonstration, Tactile cues, Verbal cues, and Handouts Education comprehension: verbalized understanding, returned demonstration, verbal cues required, and tactile cues required  HOME EXERCISE PROGRAM: Access Code: [email or web address] URL: [email or web address] Date: 07/31/2023 Prepared by: Aaron Edelman  Exercises - Seated Plantar Fascia Stretch  - 2 x daily - 7 x weekly - 2-3 reps - 20-30 seconds hold - Seated Plantar Fascia Mobilization with Small Ball  - 7 x weekly  ASSESSMENT:  CLINICAL IMPRESSION: Patient is a 53 y.o. female with a dx of plantar fasciitis. She has been dealing with this pain intermittently for 3 years.  Pt has tried various interventions.  Pt uses orthotics and has received PT which she states helps.  Her signs and sx's are consistent with dx.  Her pain is worst 1st thing in AM.  She has pain with the initial step after sitting awhile.  She has increased pain with prolonged standing.  Pt is limited with ambulation and activity due to pain.  Pt does have soft tissue tightness in R plantar fascia especially medially.  Pt has good bilat ankle strength.   OBJECTIVE IMPAIRMENTS: decreased activity tolerance, difficulty walking, increased fascial restrictions, and pain.   ACTIVITY LIMITATIONS: standing and locomotion level  PARTICIPATION LIMITATIONS: shopping and community activity  PERSONAL FACTORS: Time since onset of injury/illness/exacerbation are also affecting patient's functional outcome.   REHAB POTENTIAL: Good  CLINICAL DECISION MAKING: Stable/uncomplicated  EVALUATION COMPLEXITY: Low   GOALS:   SHORT TERM GOALS: Target date: 08/21/2023  Pt will be independent and compliant with HEP for improved pain, tolerance to activity, soft tissue mobility, strength, and function.  Baseline: Goal status: INITIAL  2.  Pt will report at least a 25% improvement in pain and sx's overall  Baseline:  Goal status:  INITIAL  3.  Pt will report improved ambulation distance with reduced pain.  Baseline:  Goal status: INITIAL   LONG TERM GOALS: Target date: 09/11/2023  Pt will demo improved tightness in plantar fascia for reduced pain and improved mobility.   Baseline:  Goal status: INITIAL  2.  Pt will report she is able to ambulate extended community distance without significant pain.  Baseline:  Goal status: INITIAL  3.  Pt will report she is ambulating without having to alter her gait due to pain.  Baseline:  Goal status: INITIAL  4.  Pt will report at least a 70% improvement in pain and sx's overall.  Baseline:  Goal status: INITIAL  5.  Pt's worst pain will be no > than 2/10 for improved pain in AM.  Baseline:  Goal status: INITIAL    PLAN:  PT FREQUENCY: 2x/week  PT DURATION: 6 weeks  PLANNED INTERVENTIONS: 97164- PT Re-evaluation, 97110-Therapeutic exercises, 97530- Therapeutic activity, [id number]- Neuromuscular re-education, 97535- Self Care, 16109- Manual therapy, [id number]- Gait training, [phone]- Aquatic Therapy, [id number]- Electrical stimulation (unattended), [id number]- Electrical stimulation (manual), [id number]- Ultrasound, 91478- Ionotophoresis 4mg /ml Dexamethasone, Patient/Family education, Stair training, Taping, Dry Needling, Joint mobilization, Cryotherapy, and Moist heat  PLAN FOR NEXT SESSION: Review HEP.  STM/IASTM to plantar fascia.  Assess soft tissue mobility in calf.  Foot intrinsic strengthening.    Audie Clear III PT, DPT 08/01/23 10:21 PM

## 2023-07-31 ENCOUNTER — Ambulatory Visit (HOSPITAL_BASED_OUTPATIENT_CLINIC_OR_DEPARTMENT_OTHER): Payer: Federal, State, Local not specified - PPO | Attending: Podiatry | Admitting: Physical Therapy

## 2023-07-31 ENCOUNTER — Other Ambulatory Visit: Payer: Self-pay

## 2023-07-31 DIAGNOSIS — R262 Difficulty in walking, not elsewhere classified: Secondary | ICD-10-CM | POA: Diagnosis present

## 2023-07-31 DIAGNOSIS — M79671 Pain in right foot: Secondary | ICD-10-CM | POA: Diagnosis present

## 2023-07-31 DIAGNOSIS — M722 Plantar fascial fibromatosis: Secondary | ICD-10-CM | POA: Diagnosis not present

## 2023-08-01 ENCOUNTER — Encounter (HOSPITAL_BASED_OUTPATIENT_CLINIC_OR_DEPARTMENT_OTHER): Payer: Self-pay | Admitting: Physical Therapy

## 2023-08-08 ENCOUNTER — Other Ambulatory Visit (HOSPITAL_BASED_OUTPATIENT_CLINIC_OR_DEPARTMENT_OTHER): Payer: Self-pay

## 2023-08-08 MED ORDER — LEVOTHYROXINE SODIUM 137 MCG PO TABS
137.0000 ug | ORAL_TABLET | Freq: Every morning | ORAL | 1 refills | Status: DC
  Filled 2023-08-08: qty 90, 90d supply, fill #0
  Filled 2023-11-11: qty 90, 90d supply, fill #1

## 2023-08-13 ENCOUNTER — Ambulatory Visit (HOSPITAL_BASED_OUTPATIENT_CLINIC_OR_DEPARTMENT_OTHER): Payer: Self-pay | Admitting: Physical Therapy

## 2023-08-13 DIAGNOSIS — M79671 Pain in right foot: Secondary | ICD-10-CM | POA: Diagnosis not present

## 2023-08-13 DIAGNOSIS — R262 Difficulty in walking, not elsewhere classified: Secondary | ICD-10-CM

## 2023-08-13 NOTE — Therapy (Signed)
 OUTPATIENT PHYSICAL THERAPY LOWER EXTREMITY TREATMENT   Patient Name: Diane Morales MRN: [id number] DOB:November 07, 1970, 53 y.o., female Today's Date: 08/14/2023  END OF SESSION:  PT End of Session - 08/13/23 0852     Visit Number 2    Number of Visits 12    Date for PT Re-Evaluation 09/11/23    Authorization Type BCBS    PT Start Time 0804    PT Stop Time 0849    PT Time Calculation (min) 45 min    Activity Tolerance Patient tolerated treatment well    Behavior During Therapy [facility] for tasks assessed/performed              Past Medical History:  Diagnosis Date   HTN (hypertension)    Past Surgical History:  Procedure Laterality Date   CESAREAN SECTION  2006   CHOLECYSTECTOMY  05/16/2017   Patient Active Problem List   Diagnosis Date Noted   Other long term (current) drug therapy 04/16/2022   Plantar fasciitis of right foot 03/05/2022   Rotator cuff tendonitis, right 03/05/2022   Umbilical hernia 03/05/2022   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   Laryngopharyngeal reflux (LPR) 03/03/2019   Neck pain 03/03/2019   Referred otalgia of right ear 03/03/2019   Shortness of breath 03/03/2019   Essential hypertension 12/09/2018   GERD (gastroesophageal reflux disease) 12/09/2018   Hyperlipidemia 12/09/2018   Chronic constipation 01/28/2018   Overweight (BMI 25.0-29.9) 01/28/2018   S/P laparoscopic cholecystectomy 05/16/2017   Celiac disease 08/08/2011   Depression 08/08/2011   Hypothyroid 06/27/2011    PCP: Julien Girt, PA-C  REFERRING PROVIDER: Vivi Barrack, DPM   REFERRING DIAG: M72.2 (ICD-10-CM) - Plantar fasciitis   THERAPY DIAG:  Pain in right foot  Difficulty in walking, not elsewhere classified  Rationale for Evaluation and Treatment: Rehabilitation  ONSET DATE: 3 year hx of pain / 07/24/23 PT order  SUBJECTIVE:   SUBJECTIVE STATEMENT: Pt states it felt the best it has felt yesterday.  She was able to walk 6  holes of golf while her son played golf.  Pt states her foot didn't bother her while walking on the golf course or afterwards.  She was also able to walk her dog without any issues during or afterwards.  Her foot is tight 1st thing in the AM.  Pt reports compliance with HEP.  Pt denies any adverse effects after prior Rx.   Her pain is worst 1st thing in AM.  She has pain with the initial step after sitting awhile.  She has increased pain if she has been on her feet a lot.    PERTINENT HISTORY: Chronic plantar foot pain HTN Celiac disease  PAIN:  Are you having pain? Yes NPRS:  1/10 current, 8/10 worst, 1-2/10 best Location:  t/o R heel including inf, post, med, and lat though worse at inferior heel Type:  stabbing, dull ache Easing factors:  orthotics, heating pad at night, stretching Aggravating Factors:  prolonged standing, worst pain 1st thing in AM, ambulation  PRECAUTIONS: None    WEIGHT BEARING RESTRICTIONS: No  FALLS:  Has patient fallen in last 6 months? 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  DIAGNOSTIC FINDINGS:  R ankle MRI on  07/12/23: IMPRESSION: 1. Plantar fasciitis, with thickening of the medial band of the plantar fascia, surrounding edema, and edema in the adjacent plantar calcaneal spur. 2. Common peroneus tendon sheath tenosynovitis. 3. Distal tibialis posterior tenosynovitis and tendinopathy. 4. Trace edema in the upper portion of Kager's fat pad but the adjacent Achilles tendon appears normal.  X rays of R foot per MD note: No evidence of acute fracture. Posterior calcaneal spurring is noted.                                                                                                                                 TREATMENT DATE:    Therapeutic Exercise:   Reviewed response to prior Rx, current function, HEP compliance, and pt presentation. Plantar fascial rolling approx 2 mins Towel scrunches x 5 reps Marble pick up (5 marbles)   plantar fascia stretch 3x30 seconds.    Standing gastroc stretch 2x20-30 sec at wall Standing soleus stretch 2x20-30 sec  Reviewed HEP. Pt received a HEP handout and was educated in correct form and appropriate frequency.   Manual therapy: Pt received STM to R plantar fasica in longsitting to improve soft tissue tightness and mobility and reduce pain.   PATIENT EDUCATION:  Education details: HEP, POC, dx, rationale of interventions, objective findings, relevant anatomy, and what to expect next Rx.   Person educated: Patient Education method: Explanation, Demonstration, Tactile cues, Verbal cues, and Handouts Education comprehension: verbalized understanding, returned demonstration, verbal cues required, and tactile cues required  HOME EXERCISE PROGRAM: Access Code: [email or web address] URL: [email or web address] Date: 07/31/2023 Prepared by: Aaron Edelman  Exercises - Seated Plantar Fascia Stretch  - 2 x daily - 7 x weekly - 2-3 reps - 20-30 seconds hold - Seated Plantar Fascia Mobilization with Small Ball  - 7 x weekly  Updated HEP: - Gastroc Stretch on Wall  - 2 x daily - 7 x weekly - 2-3 reps - 20-30 seconds hold - Seated Toe Towel Scrunches  - 1 x daily - 7 x weekly - 1-2 sets - 5 reps  ASSESSMENT:  CLINICAL IMPRESSION: Pt presents to Rx feeling better.  She was able to play golf and walk dog without increased pain during and afterwards.  PT focused on plantar fascial mobility, calf flexibility, and foot intrinsic strengthening.  Pt performed exercises well with cuing and instruction in correct form.  PT updated HEP and gave pt a HEP handout.  She responded well to Rx reporting no increased pain after Rx.   She should benefit from skilled PT to address impairments and goals and improve overall function.   OBJECTIVE IMPAIRMENTS: decreased activity tolerance, difficulty walking, increased fascial restrictions, and pain.   ACTIVITY LIMITATIONS: standing and locomotion level  PARTICIPATION LIMITATIONS: shopping and community activity  PERSONAL FACTORS: Time since onset of injury/illness/exacerbation are also affecting patient's functional outcome.   REHAB POTENTIAL: Good  CLINICAL DECISION MAKING: Stable/uncomplicated  EVALUATION COMPLEXITY: Low   GOALS:  SHORT TERM GOALS: Target date: 08/21/2023  Pt will be independent and compliant with HEP for improved pain, tolerance to activity, soft tissue mobility, strength, and function.  Baseline: Goal status: INITIAL  2.  Pt will report at least a 25% improvement in pain and sx's overall  Baseline:  Goal status: INITIAL  3.  Pt will report improved ambulation distance with reduced pain.  Baseline:  Goal status: INITIAL   LONG TERM GOALS: Target date: 09/11/2023  Pt will demo improved tightness in plantar fascia for reduced pain and improved mobility.   Baseline:  Goal status: INITIAL  2.  Pt will report she is able to ambulate extended community distance without significant pain.  Baseline:  Goal status: INITIAL  3.  Pt will report she is ambulating without having to alter her gait due to pain.  Baseline:  Goal status: INITIAL  4.  Pt will report at least a 70% improvement in pain and sx's overall.  Baseline:  Goal status: INITIAL  5.  Pt's worst pain will be no > than 2/10 for improved pain in AM.  Baseline:  Goal status: INITIAL    PLAN:  PT FREQUENCY: 2x/week  PT DURATION: 6 weeks  PLANNED INTERVENTIONS: 97164- PT Re-evaluation, 97110-Therapeutic exercises, 97530- Therapeutic activity, [id number]- Neuromuscular re-education, 97535- Self Care, 16109- Manual therapy, [id number]- Gait training, [phone]- Aquatic Therapy, [phone]-  Electrical stimulation (unattended), [id number]- Electrical stimulation (manual), [id number]- Ultrasound, 91478- Ionotophoresis 4mg /ml Dexamethasone, Patient/Family education, Stair training, Taping, Dry Needling, Joint mobilization, Cryotherapy, and Moist heat  PLAN FOR NEXT SESSION: Review HEP.  STM/IASTM to plantar fascia.  Assess soft tissue mobility in calf.  Foot intrinsic strengthening.    Audie Clear III PT, DPT 08/14/23 10:32 AM

## 2023-08-14 ENCOUNTER — Encounter (HOSPITAL_BASED_OUTPATIENT_CLINIC_OR_DEPARTMENT_OTHER): Payer: Self-pay | Admitting: Physical Therapy

## 2023-08-20 ENCOUNTER — Ambulatory Visit (HOSPITAL_BASED_OUTPATIENT_CLINIC_OR_DEPARTMENT_OTHER): Payer: Self-pay | Admitting: Physical Therapy

## 2023-08-20 ENCOUNTER — Encounter (HOSPITAL_BASED_OUTPATIENT_CLINIC_OR_DEPARTMENT_OTHER): Payer: Self-pay | Admitting: Physical Therapy

## 2023-08-20 DIAGNOSIS — M79671 Pain in right foot: Secondary | ICD-10-CM | POA: Diagnosis not present

## 2023-08-20 DIAGNOSIS — R262 Difficulty in walking, not elsewhere classified: Secondary | ICD-10-CM

## 2023-08-20 NOTE — Therapy (Incomplete)
 OUTPATIENT PHYSICAL THERAPY LOWER EXTREMITY TREATMENT   Patient Name: Diane Morales MRN: [id number] DOB:May 09, 1971, 53 y.o., female Today's Date: 08/21/2023  END OF SESSION:  PT End of Session - 08/20/23 1452     Visit Number 3    Number of Visits 12    Date for PT Re-Evaluation 09/11/23    Authorization Type BCBS    PT Start Time 1451    PT Stop Time 1536    PT Time Calculation (min) 45 min    Activity Tolerance Patient tolerated treatment well    Behavior During Therapy [facility] for tasks assessed/performed              Past Medical History:  Diagnosis Date   HTN (hypertension)    Past Surgical History:  Procedure Laterality Date   CESAREAN SECTION  2006   CHOLECYSTECTOMY  05/16/2017   Patient Active Problem List   Diagnosis Date Noted   Other long term (current) drug therapy 04/16/2022   Plantar fasciitis of right foot 03/05/2022   Rotator cuff tendonitis, right 03/05/2022   Umbilical hernia 03/05/2022   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   Laryngopharyngeal reflux (LPR) 03/03/2019   Neck pain 03/03/2019   Referred otalgia of right ear 03/03/2019   Shortness of breath 03/03/2019   Essential hypertension 12/09/2018   GERD (gastroesophageal reflux disease) 12/09/2018   Hyperlipidemia 12/09/2018   Chronic constipation 01/28/2018   Overweight (BMI 25.0-29.9) 01/28/2018   S/P laparoscopic cholecystectomy 05/16/2017   Celiac disease 08/08/2011   Depression 08/08/2011   Hypothyroid 06/27/2011    PCP: Julien Girt, PA-C  REFERRING PROVIDER: Vivi Barrack, DPM   REFERRING DIAG: M72.2 (ICD-10-CM) - Plantar fasciitis   THERAPY DIAG:  Pain in right foot  Difficulty in walking, not elsewhere classified  Rationale for Evaluation and Treatment: Rehabilitation  ONSET DATE: 3 year hx of pain / 07/24/23 PT order  SUBJECTIVE:   SUBJECTIVE STATEMENT: Pt states she felt good after prior Rx.  Pt has been trying to be more active,  and it feels better.  Pt still has pain 1st thing in AM and has pain with walking to the B/R.  Pt reports reduced duration of pain 1st thing in AM.  Pt reports she is walking 2-2.5 miles in the evening without adverse effects.    PERTINENT HISTORY: Chronic plantar foot pain HTN Celiac disease  PAIN:  Are you having pain? Yes NPRS:  1-2/10 current, 8/10 worst, 1-2/10 best Location:  t/o R heel including inf, post, med, and lat though worse at inferior heel Type:  stabbing, dull ache Easing factors:  orthotics, heating pad at night, stretching Aggravating Factors:  prolonged standing, worst pain 1st thing in AM, ambulation  PRECAUTIONS: None    WEIGHT BEARING RESTRICTIONS: No  FALLS:  Has patient fallen in last 6 months? 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  DIAGNOSTIC FINDINGS:  R ankle MRI on 07/12/23: IMPRESSION: 1. Plantar fasciitis, with thickening of the medial band of the plantar fascia, surrounding edema, and edema in the adjacent plantar calcaneal spur. 2. Common peroneus tendon sheath tenosynovitis. 3. Distal tibialis posterior tenosynovitis and tendinopathy. 4. Trace edema in the upper portion of Kager's fat pad but the adjacent Achilles  tendon appears normal.  X rays of R foot per MD note: No evidence of acute fracture. Posterior calcaneal spurring is noted.                                                                                                                                TREATMENT DATE:    Therapeutic Exercise:   Reviewed response to prior Rx, current function, HEP compliance, and pt presentation. Plantar fascial rolling x 3 mins Towel scrunches x 5 reps Marble  pick up (5 marbles)   plantar fascia stretch 3x30 seconds.    Standing gastroc stretch 2x30 sec at wall Standing soleus stretch 2x20-30 sec   Manual therapy: Pt received STM/IASTM to R plantar fasica in longsitting to improve soft tissue tightness and pain and reduce myofascial adhesions and restrictions.   PATIENT EDUCATION:  Education details: HEP, POC, dx, rationale of interventions, exercise form, relevant anatomy, and what to expect next Rx.  PT answered pt's questions.  Person educated: Patient Education method: Explanation, Demonstration, Tactile cues, Verbal cues, and Handouts Education comprehension: verbalized understanding, returned demonstration, verbal cues required, and tactile cues required  HOME EXERCISE PROGRAM: Access Code: [email or web address] URL: [email or web address] Date: 07/31/2023 Prepared by: Aaron Edelman  Exercises - Seated Plantar Fascia Stretch  - 2 x daily - 7 x weekly - 2-3 reps - 20-30 seconds hold - Seated Plantar Fascia Mobilization with Small Ball  - 7 x weekly - Gastroc Stretch on Wall  - 2 x daily - 7 x weekly - 2-3 reps - 20-30 seconds hold - Seated Toe Towel Scrunches  - 1 x daily - 7 x weekly - 1-2 sets - 5 reps  ASSESSMENT:  CLINICAL IMPRESSION: Pt is improving with sx's as evidenced by subjective reports.  She continues to have pain 1st thing in AM though reports reduced duration of pain 1st thing in AM.  Pt performed exercises well and demonstrates improved form with standing calf stretch at wall.  Pt has good form and control with foot intrinsic exercises.  PT performed IASTM and pt had an appropriate response.  She responded well to Rx stating she felt better after Rx.  She states she still has pain with pressure on 1 spot on heel.  She should benefit from continued skilled PT to address impairments and goals and improve overall function.   OBJECTIVE IMPAIRMENTS: decreased activity tolerance, difficulty walking, increased fascial  restrictions, and pain.   ACTIVITY LIMITATIONS: standing and locomotion level  PARTICIPATION LIMITATIONS: shopping and community activity  PERSONAL FACTORS: Time since onset of injury/illness/exacerbation are also affecting patient's functional outcome.   REHAB POTENTIAL: Good  CLINICAL DECISION MAKING: Stable/uncomplicated  EVALUATION COMPLEXITY: Low   GOALS:   SHORT TERM GOALS: Target date: 08/21/2023  Pt will be independent and compliant with HEP for improved pain, tolerance to activity, soft tissue mobility, strength, and function.  Baseline: Goal status: INITIAL  2.  Pt will report at least a  25% improvement in pain and sx's overall  Baseline:  Goal status: INITIAL  3.  Pt will report improved ambulation distance with reduced pain.  Baseline:  Goal status: INITIAL   LONG TERM GOALS: Target date: 09/11/2023  Pt will demo improved tightness in plantar fascia for reduced pain and improved mobility.   Baseline:  Goal status: INITIAL  2.  Pt will report she is able to ambulate extended community distance without significant pain.  Baseline:  Goal status: INITIAL  3.  Pt will report she is ambulating without having to alter her gait due to pain.  Baseline:  Goal status: INITIAL  4.  Pt will report at least a 70% improvement in pain and sx's overall.  Baseline:  Goal status: INITIAL  5.  Pt's worst pain will be no > than 2/10 for improved pain in AM.  Baseline:  Goal status: INITIAL    PLAN:  PT FREQUENCY: 2x/week  PT DURATION: 6 weeks  PLANNED INTERVENTIONS: 97164- PT Re-evaluation, 97110-Therapeutic exercises, 97530- Therapeutic activity, [id number]- Neuromuscular re-education, 97535- Self Care, 16109- Manual therapy, [id number]- Gait training, [phone]- Aquatic Therapy, [phone]- Electrical stimulation (unattended), [id number]- Electrical stimulation (manual), [id number]- Ultrasound, 91478- Ionotophoresis 4mg /ml Dexamethasone, Patient/Family education, Stair training, Taping, Dry  Needling, Joint mobilization, Cryotherapy, and Moist heat  PLAN FOR NEXT SESSION: STM/IASTM to plantar fascia.  Assess soft tissue mobility in calf.  Foot intrinsic strengthening.    Audie Clear III PT, DPT 08/21/23 9:24 PM

## 2023-08-22 ENCOUNTER — Ambulatory Visit (HOSPITAL_BASED_OUTPATIENT_CLINIC_OR_DEPARTMENT_OTHER): Payer: Federal, State, Local not specified - PPO | Admitting: Physical Therapy

## 2023-08-23 ENCOUNTER — Encounter (HOSPITAL_BASED_OUTPATIENT_CLINIC_OR_DEPARTMENT_OTHER)

## 2023-08-27 ENCOUNTER — Encounter (HOSPITAL_BASED_OUTPATIENT_CLINIC_OR_DEPARTMENT_OTHER): Payer: Self-pay | Admitting: Physical Therapy

## 2023-08-27 ENCOUNTER — Ambulatory Visit (HOSPITAL_BASED_OUTPATIENT_CLINIC_OR_DEPARTMENT_OTHER): Attending: Podiatry | Admitting: Physical Therapy

## 2023-08-27 DIAGNOSIS — M79671 Pain in right foot: Secondary | ICD-10-CM | POA: Diagnosis present

## 2023-08-27 DIAGNOSIS — R262 Difficulty in walking, not elsewhere classified: Secondary | ICD-10-CM | POA: Insufficient documentation

## 2023-08-27 NOTE — Therapy (Signed)
 OUTPATIENT PHYSICAL THERAPY LOWER EXTREMITY TREATMENT   Patient Name: Diane Morales MRN: [id number] DOB:[birth year], 54 y.o., female Today's Date: 08/28/2023  END OF SESSION:  PT End of Session - 08/27/23 1539     Visit Number 4    Number of Visits 12    Date for PT Re-Evaluation 09/11/23    Authorization Type BCBS    PT Start Time 1534    PT Stop Time 1618    PT Time Calculation (min) 44 min    Activity Tolerance Patient tolerated treatment well    Behavior During Therapy [facility] for tasks assessed/performed               Past Medical History:  Diagnosis Date   HTN (hypertension)    Past Surgical History:  Procedure Laterality Date   CESAREAN SECTION  2006   CHOLECYSTECTOMY  05/16/2017   Patient Active Problem List   Diagnosis Date Noted   Other long term (current) drug therapy 04/16/2022   Plantar fasciitis of right foot 03/05/2022   Rotator cuff tendonitis, right 03/05/2022   Umbilical hernia 03/05/2022   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   Laryngopharyngeal reflux (LPR) 03/03/2019   Neck pain 03/03/2019   Referred otalgia of right ear 03/03/2019   Shortness of breath 03/03/2019   Essential hypertension 12/09/2018   GERD (gastroesophageal reflux disease) 12/09/2018   Hyperlipidemia 12/09/2018   Chronic constipation 01/28/2018   Overweight (BMI 25.0-29.9) 01/28/2018   S/P laparoscopic cholecystectomy 05/16/2017   Celiac disease 08/08/2011   Depression 08/08/2011   Hypothyroid 06/27/2011    PCP: Julien Girt, PA-C  REFERRING PROVIDER: Vivi Barrack, DPM   REFERRING DIAG: M72.2 (ICD-10-CM) - Plantar fasciitis   THERAPY DIAG:  Pain in right foot  Difficulty in walking, not elsewhere classified  Rationale for Evaluation and Treatment: Rehabilitation  ONSET DATE: 3 year hx of pain / 07/24/23 PT order  SUBJECTIVE:   SUBJECTIVE STATEMENT: Pt had increased pain on Saturday and Sunday due to being on her feet a lot  walking Saturday and driving a lot on Sunday.  Pt wakes up in the AM and feels like a sprained ankle.  She has pain t/o ankle and foot.  Pt states she felt good after prior Rx.      PERTINENT HISTORY: Chronic plantar foot pain HTN Celiac disease  PAIN:  Are you having pain? Yes NPRS:  1/10 current, 8/10 worst, 1-2/10 best Location:  t/o R heel including inf, post, med, and lat though worse at inferior heel Type:  stabbing, dull ache Easing factors:  orthotics, heating pad at night, stretching Aggravating Factors:  prolonged standing, worst pain 1st thing in AM, ambulation  PRECAUTIONS: None    WEIGHT BEARING RESTRICTIONS: No  FALLS:  Has patient fallen in last 6 months? 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  DIAGNOSTIC FINDINGS:  R ankle MRI on 07/12/23: IMPRESSION: 1. Plantar fasciitis, with thickening of the medial band of the plantar fascia, surrounding edema, and edema in the adjacent plantar calcaneal spur. 2. Common peroneus tendon sheath tenosynovitis. 3. Distal tibialis posterior tenosynovitis and tendinopathy. 4. Trace edema in the upper portion of Kager's fat pad but the adjacent Achilles tendon appears normal.  X rays  of R foot per MD note: No evidence of acute fracture. Posterior calcaneal spurring is noted.                                                                                                                                TREATMENT DATE:    Therapeutic Exercise:   Reviewed response to prior Rx, current function, HEP compliance, and pt presentation. Manual gastroc stretch 3x30 sec   Towel scrunches  Marble pick up (10 marbles) Standing plantar fascia stretch with  foot on wall 2x30 seconds.     Neuro Re-ed Actities: Step ups on airex fwd and lateral x 20 each Tandem gait x 2 laps Tandem stance on airex 2x 30 sec each   Manual therapy: Pt received STM to R calf and IASTM to R plantar fasica in prone to improve soft tissue tightness and pain and reduce myofascial adhesions and restrictions.   PATIENT EDUCATION:  Education details:  PT educated pt she could use a roller on calf to improve soft tissue tightness.  HEP, POC, dx, rationale of interventions, exercise form, relevant anatomy, and what to expect next Rx.  PT answered pt's questions.  Person educated: Patient Education method: Explanation, Demonstration, Tactile cues, Verbal cues Education comprehension: verbalized understanding, returned demonstration, verbal cues required, and tactile cues required  HOME EXERCISE PROGRAM: Access Code: [email or web address] URL: [email or web address] Date: 07/31/2023 Prepared by: Aaron Edelman  Exercises - Seated Plantar Fascia Stretch  - 2 x daily - 7 x weekly - 2-3 reps - 20-30 seconds hold - Seated Plantar Fascia Mobilization with Small Ball  - 7 x weekly - Gastroc Stretch on Wall  - 2 x daily - 7 x weekly - 2-3 reps - 20-30 seconds hold - Seated Toe Towel Scrunches  - 1 x daily - 7 x weekly - 1-2 sets - 5 reps  ASSESSMENT:  CLINICAL IMPRESSION: Pt has soft tissue tightness in calf and PT performed STM to R calf.  Pt tolerated soft tissue work to calf and plantar fascia well and had an appropriate response to STM and IASTM.  She continues to have pain 1st thing in AM and reports pain t/o ankle and foot.  PT progressed Wb'ing exercises today by adding proprioceptive exercises.  She responded well to Rx having no increased pain after Rx.  She should benefit from continued skilled PT to address impairments and goals and improve overall function.       OBJECTIVE IMPAIRMENTS: decreased activity tolerance, difficulty walking, increased fascial  restrictions, and pain.   ACTIVITY LIMITATIONS: standing and locomotion level  PARTICIPATION LIMITATIONS: shopping and community activity  PERSONAL FACTORS: Time since onset of injury/illness/exacerbation are also affecting patient's functional outcome.   REHAB POTENTIAL: Good  CLINICAL DECISION MAKING: Stable/uncomplicated  EVALUATION COMPLEXITY: Low   GOALS:   SHORT TERM GOALS: Target date: 08/21/2023  Pt will be independent and compliant with HEP for improved pain, tolerance to activity, soft tissue mobility,  strength, and function.  Baseline: Goal status: INITIAL  2.  Pt will report at least a 25% improvement in pain and sx's overall  Baseline:  Goal status: INITIAL  3.  Pt will report improved ambulation distance with reduced pain.  Baseline:  Goal status: INITIAL   LONG TERM GOALS: Target date: 09/11/2023  Pt will demo improved tightness in plantar fascia for reduced pain and improved mobility.   Baseline:  Goal status: INITIAL  2.  Pt will report she is able to ambulate extended community distance without significant pain.  Baseline:  Goal status: INITIAL  3.  Pt will report she is ambulating without having to alter her gait due to pain.  Baseline:  Goal status: INITIAL  4.  Pt will report at least a 70% improvement in pain and sx's overall.  Baseline:  Goal status: INITIAL  5.  Pt's worst pain will be no > than 2/10 for improved pain in AM.  Baseline:  Goal status: INITIAL    PLAN:  PT FREQUENCY: 2x/week  PT DURATION: 6 weeks  PLANNED INTERVENTIONS: 97164- PT Re-evaluation, 97110-Therapeutic exercises, 97530- Therapeutic activity, [id number]- Neuromuscular re-education, 97535- Self Care, [id number]- Manual therapy, [id number]- Gait training, [phone]- Aquatic Therapy, [id number]- Electrical stimulation (unattended), [id number]- Electrical stimulation (manual), [id number]- Ultrasound, [id number]- Ionotophoresis 4mg /ml Dexamethasone, Patient/Family education, Stair training, Taping, Dry  Needling, Joint mobilization, Cryotherapy, and Moist heat  PLAN FOR NEXT SESSION: STM/IASTM to plantar fascia.  Assess soft tissue mobility in calf.  Foot intrinsic strengthening.    Audie Clear III PT, DPT 08/28/23 3:59 PM

## 2023-08-29 ENCOUNTER — Ambulatory Visit (HOSPITAL_BASED_OUTPATIENT_CLINIC_OR_DEPARTMENT_OTHER): Admitting: Physical Therapy

## 2023-08-29 ENCOUNTER — Encounter (HOSPITAL_BASED_OUTPATIENT_CLINIC_OR_DEPARTMENT_OTHER): Payer: Self-pay | Admitting: Physical Therapy

## 2023-08-29 DIAGNOSIS — R262 Difficulty in walking, not elsewhere classified: Secondary | ICD-10-CM

## 2023-08-29 DIAGNOSIS — M79671 Pain in right foot: Secondary | ICD-10-CM | POA: Diagnosis not present

## 2023-08-29 NOTE — Therapy (Signed)
 OUTPATIENT PHYSICAL THERAPY LOWER EXTREMITY TREATMENT   Patient Name: Diane Morales MRN: [id number] DOB:Jul 18, 1970, 53 y.o., female Today's Date: 08/30/2023  END OF SESSION:  PT End of Session - 08/29/23 1628     Visit Number 5    Number of Visits 12    Date for PT Re-Evaluation 09/11/23    Authorization Type BCBS    PT Start Time 1625   pt arrived 10 mins late to treatment   PT Stop Time 1659    PT Time Calculation (min) 34 min    Activity Tolerance Patient tolerated treatment well    Behavior During Therapy [facility] for tasks assessed/performed               Past Medical History:  Diagnosis Date   HTN (hypertension)    Past Surgical History:  Procedure Laterality Date   CESAREAN SECTION  2006   CHOLECYSTECTOMY  05/16/2017   Patient Active Problem List   Diagnosis Date Noted   Other long term (current) drug therapy 04/16/2022   Plantar fasciitis of right foot 03/05/2022   Rotator cuff tendonitis, right 03/05/2022   Umbilical hernia 03/05/2022   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   Laryngopharyngeal reflux (LPR) 03/03/2019   Neck pain 03/03/2019   Referred otalgia of right ear 03/03/2019   Shortness of breath 03/03/2019   Essential hypertension 12/09/2018   GERD (gastroesophageal reflux disease) 12/09/2018   Hyperlipidemia 12/09/2018   Chronic constipation 01/28/2018   Overweight (BMI 25.0-29.9) 01/28/2018   S/P laparoscopic cholecystectomy 05/16/2017   Celiac disease 08/08/2011   Depression 08/08/2011   Hypothyroid 06/27/2011    PCP: Julien Girt, PA-C  REFERRING PROVIDER: Vivi Barrack, DPM   REFERRING DIAG: M72.2 (ICD-10-CM) - Plantar fasciitis   THERAPY DIAG:  Pain in right foot  Difficulty in walking, not elsewhere classified  Rationale for Evaluation and Treatment: Rehabilitation  ONSET DATE: 3 year hx of pain / 07/24/23 PT order  SUBJECTIVE:   SUBJECTIVE STATEMENT: Pt denies any adverse effects after  prior Rx.  She has pain t/o ankle and foot 1st thing in AM.      PERTINENT HISTORY: Chronic plantar foot pain HTN Celiac disease  PAIN:  Are you having pain? Yes NPRS:  1/10 current, 8/10 worst, 1-2/10 best Location:  t/o R heel including inf, post, med, and lat though worse at inferior heel Type:  stabbing, dull ache Easing factors:  orthotics, heating pad at night, stretching Aggravating Factors:  prolonged standing, worst pain 1st thing in AM, ambulation  PRECAUTIONS: None    WEIGHT BEARING RESTRICTIONS: No  FALLS:  Has patient fallen in last 6 months? 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  DIAGNOSTIC FINDINGS:  R ankle MRI on 07/12/23: IMPRESSION: 1. Plantar fasciitis, with thickening of the medial band of the plantar fascia, surrounding edema, and edema in the adjacent plantar calcaneal spur. 2. Common peroneus tendon sheath tenosynovitis. 3. Distal tibialis posterior tenosynovitis and tendinopathy. 4. Trace edema in the upper portion of Kager's fat pad but the adjacent Achilles tendon appears normal.  X rays of R foot per MD note: No evidence of acute fracture. Posterior calcaneal spurring is noted.  TREATMENT DATE:    Therapeutic Exercise:   Standing plantar fascia stretch with foot on wall 2x30 seconds.     Neuro Re-ed Actities: Step ups on airex fwd and lateral x 20 each Tandem stance on airex 2x30 sec each  Manual therapy: Pt received STM and rolling to R calf in prone and STM and IASTM to R plantar fasica in longsitting to improve soft tissue  tightness and pain and reduce myofascial adhesions and restrictions.   PATIENT EDUCATION:  Education details:  PT educated pt she could use a roller on calf to improve soft tissue tightness.  HEP, POC, dx, rationale of interventions, exercise form, and relevant anatomy.  PT answered pt's questions.  Person educated: Patient Education method: Explanation, Demonstration, Verbal cues Education comprehension: verbalized understanding, returned demonstration, and verbal cues required  HOME EXERCISE PROGRAM: Access Code: [email or web address] URL: [email or web address] Date: 07/31/2023 Prepared by: Aaron Edelman  Exercises - Seated Plantar Fascia Stretch  - 2 x daily - 7 x weekly - 2-3 reps - 20-30 seconds hold - Seated Plantar Fascia Mobilization with Small Ball  - 7 x weekly - Gastroc Stretch on Wall  - 2 x daily - 7 x weekly - 2-3 reps - 20-30 seconds hold - Seated Toe Towel Scrunches  - 1 x daily - 7 x weekly - 1-2 sets - 5 reps  ASSESSMENT:  CLINICAL IMPRESSION: Pt had a meeting prior to treatment and informed PT that she would be late.  Treatment time was limited due to pt arriving later after her meeting.  Pt has soft tissue tightness in calf and PT performed STM and rolling to R calf.  Pt tolerated soft tissue work to calf and plantar fascia well and had an appropriate response to STM and IASTM.  She continues to have pain 1st thing in AM and reports pain t/o ankle and foot.  Pt performed proprioceptive activities well.  She responded well to Rx having no increased pain after Rx.  She should benefit from continued skilled PT to address impairments and goals and improve overall function.       OBJECTIVE IMPAIRMENTS: decreased activity tolerance, difficulty walking, increased fascial restrictions, and pain.   ACTIVITY LIMITATIONS: standing and locomotion level  PARTICIPATION LIMITATIONS: shopping and community activity  PERSONAL FACTORS: Time since onset of  injury/illness/exacerbation are also affecting patient's functional outcome.   REHAB POTENTIAL: Good  CLINICAL DECISION MAKING: Stable/uncomplicated  EVALUATION COMPLEXITY: Low   GOALS:   SHORT TERM GOALS: Target date: 08/21/2023  Pt will be independent and compliant with HEP for improved pain, tolerance to activity, soft tissue mobility, strength, and function.  Baseline: Goal status: INITIAL  2.  Pt will report at least a 25% improvement in pain and sx's overall  Baseline:  Goal status: INITIAL  3.  Pt will report improved ambulation distance with reduced pain.  Baseline:  Goal status: INITIAL   LONG TERM GOALS: Target date: 09/11/2023  Pt will demo improved tightness in plantar fascia for reduced pain and improved mobility.   Baseline:  Goal status: INITIAL  2.  Pt will report she is able to ambulate extended community distance without significant pain.  Baseline:  Goal status: INITIAL  3.  Pt will report she is ambulating without having to alter her gait due to pain.  Baseline:  Goal status: INITIAL  4.  Pt will report at least a 70% improvement in pain and sx's overall.  Baseline:  Goal status: INITIAL  5.  Pt's worst pain will  be no > than 2/10 for improved pain in AM.  Baseline:  Goal status: INITIAL    PLAN:  PT FREQUENCY: 2x/week  PT DURATION: 6 weeks  PLANNED INTERVENTIONS: 97164- PT Re-evaluation, 97110-Therapeutic exercises, 97530- Therapeutic activity, [id number]- Neuromuscular re-education, 97535- Self Care, 16109- Manual therapy, [id number]- Gait training, [id number]- Aquatic Therapy, [phone]- Electrical stimulation (unattended), [id number]- Electrical stimulation (manual), [id number]- Ultrasound, 91478- Ionotophoresis 4mg /ml Dexamethasone, Patient/Family education, Stair training, Taping, Dry Needling, Joint mobilization, Cryotherapy, and Moist heat  PLAN FOR NEXT SESSION: STM/IASTM to plantar fascia.  Assess soft tissue mobility in calf.  Foot intrinsic strengthening.     Audie Clear III PT, DPT 08/30/23 1:44 PM

## 2023-09-03 ENCOUNTER — Encounter (HOSPITAL_BASED_OUTPATIENT_CLINIC_OR_DEPARTMENT_OTHER): Payer: Self-pay | Admitting: Physical Therapy

## 2023-09-03 ENCOUNTER — Ambulatory Visit (HOSPITAL_BASED_OUTPATIENT_CLINIC_OR_DEPARTMENT_OTHER): Admitting: Physical Therapy

## 2023-09-03 DIAGNOSIS — R262 Difficulty in walking, not elsewhere classified: Secondary | ICD-10-CM

## 2023-09-03 DIAGNOSIS — M79671 Pain in right foot: Secondary | ICD-10-CM | POA: Diagnosis not present

## 2023-09-03 NOTE — Therapy (Signed)
 OUTPATIENT PHYSICAL THERAPY LOWER EXTREMITY TREATMENT   Patient Name: Diane Morales MRN: [id number] DOB:Nov 30, 1970, 53 y.o., female Today's Date: 09/04/2023  END OF SESSION:  PT End of Session - 09/03/23 1554     Visit Number 6    Number of Visits 12    Date for PT Re-Evaluation 09/11/23    Authorization Type BCBS    PT Start Time 1545    PT Stop Time 1628    PT Time Calculation (min) 43 min    Activity Tolerance Patient tolerated treatment well    Behavior During Therapy [facility] for tasks assessed/performed                Past Medical History:  Diagnosis Date   HTN (hypertension)    Past Surgical History:  Procedure Laterality Date   CESAREAN SECTION  2006   CHOLECYSTECTOMY  05/16/2017   Patient Active Problem List   Diagnosis Date Noted   Other long term (current) drug therapy 04/16/2022   Plantar fasciitis of right foot 03/05/2022   Rotator cuff tendonitis, right 03/05/2022   Umbilical hernia 03/05/2022   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   Laryngopharyngeal reflux (LPR) 03/03/2019   Neck pain 03/03/2019   Referred otalgia of right ear 03/03/2019   Shortness of breath 03/03/2019   Essential hypertension 12/09/2018   GERD (gastroesophageal reflux disease) 12/09/2018   Hyperlipidemia 12/09/2018   Chronic constipation 01/28/2018   Overweight (BMI 25.0-29.9) 01/28/2018   S/P laparoscopic cholecystectomy 05/16/2017   Celiac disease 08/08/2011   Depression 08/08/2011   Hypothyroid 06/27/2011    PCP: Julien Girt, PA-C  REFERRING PROVIDER: Vivi Barrack, DPM   REFERRING DIAG: M72.2 (ICD-10-CM) - Plantar fasciitis   THERAPY DIAG:  Pain in right foot  Difficulty in walking, not elsewhere classified  Rationale for Evaluation and Treatment: Rehabilitation  ONSET DATE: 3 year hx of pain / 07/24/23 PT order  SUBJECTIVE:   SUBJECTIVE STATEMENT: Pt states she had a bad weekend.  She had increased pain this past weekend.   Pt decreased her walking yesterday which helped.  She has throbbing some nights when she goes to bed.  Pt reports 3/10 pain currently at rest though increases when she walks.  Pt denies any adverse effects after prior Rx.     PERTINENT HISTORY: Chronic plantar foot pain HTN Celiac disease  PAIN:  Are you having pain? Yes NPRS:  3/10 current, 8/10 worst, 1-2/10 best Location:  t/o R heel including inf, post, med, and lat though worse at inferior heel Type:  stabbing, dull ache Easing factors:  orthotics, heating pad at night, stretching Aggravating Factors:  prolonged standing, worst pain 1st thing in AM, ambulation  PRECAUTIONS: None    WEIGHT BEARING RESTRICTIONS: No  FALLS:  Has patient fallen in last 6 months? 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  DIAGNOSTIC FINDINGS:  R ankle MRI on 07/12/23: IMPRESSION: 1. Plantar fasciitis, with thickening of the medial band of the plantar fascia, surrounding edema, and edema in the adjacent plantar calcaneal spur. 2. Common peroneus tendon sheath tenosynovitis. 3. Distal tibialis posterior tenosynovitis and tendinopathy. 4. Trace edema in the upper portion of Kager's fat pad but the adjacent Achilles tendon appears normal.  X rays of R foot per MD note: No evidence of acute fracture. Posterior calcaneal spurring is noted.                                                                                                                                TREATMENT DATE:    Therapeutic Exercise:   Reviewed pt presentation, pain level, and response to prior Rx. Rolling of plantar fascia x 4 mins Standing plantar fascia stretch with foot on wall 2x30  seconds.    Marble pick up (10) x 4 reps Standing wall calf stretch 2x30 sec  Neuro Re-ed Actities: Lateral Step ups on airex approx 12-15 x 2 reps Tandem stance on airex 2x30 sec each Marching on airex 2x10 BAPS board 2x10 f/b, cw, and ccw  Manual therapy: Pt received rolling to R calf in prone and STM to R plantar fasica in longsitting to improve soft tissue tightness and pain and reduce myofascial adhesions and restrictions.   PATIENT EDUCATION:  Education details:  HEP, POC, dx, rationale of interventions, exercise form, and relevant anatomy.  PT answered pt's questions.  Person educated: Patient Education method: Explanation, Demonstration, Verbal cues Education comprehension: verbalized understanding, returned demonstration, and verbal cues required  HOME EXERCISE PROGRAM: Access Code: [email or web address] URL: [email or web address] Date: 07/31/2023 Prepared by: Aaron Edelman  Exercises - Seated Plantar Fascia Stretch  - 2 x daily - 7 x weekly - 2-3 reps - 20-30 seconds hold - Seated Plantar Fascia Mobilization with Small Ball  - 7 x weekly - Gastroc Stretch on Wall  - 2 x daily - 7 x weekly - 2-3 reps - 20-30 seconds hold - Seated Toe Towel Scrunches  - 1 x daily - 7 x weekly - 1-2 sets - 5 reps  ASSESSMENT:  CLINICAL IMPRESSION: Pt presents to Rx reporting having increased pain this past weekend.  Pt performed exercises to improve foot intrinsic strength, proprioception, calf flexibility, and plantar fascial mobility.  PT performed manual techniques to improve soft tissue tightness in calf and plantar fascia.  Pt responded well to Rx reporting no increased pain and states she feels a little better after Rx.  She should benefit from continued skilled PT to address impairments and goals and improve overall function.       OBJECTIVE IMPAIRMENTS: decreased activity tolerance, difficulty walking, increased fascial restrictions, and pain.   ACTIVITY LIMITATIONS: standing  and locomotion level  PARTICIPATION LIMITATIONS: shopping and community activity  PERSONAL FACTORS: Time since onset of injury/illness/exacerbation are also affecting patient's functional outcome.   REHAB POTENTIAL: Good  CLINICAL DECISION MAKING: Stable/uncomplicated  EVALUATION COMPLEXITY: Low   GOALS:   SHORT TERM GOALS: Target date: 08/21/2023  Pt will be independent and compliant with HEP for improved pain, tolerance to activity, soft tissue mobility, strength, and function.  Baseline: Goal status: INITIAL  2.  Pt will report at least a 25% improvement in pain and sx's overall  Baseline:  Goal  status: INITIAL  3.  Pt will report improved ambulation distance with reduced pain.  Baseline:  Goal status: INITIAL   LONG TERM GOALS: Target date: 09/11/2023  Pt will demo improved tightness in plantar fascia for reduced pain and improved mobility.   Baseline:  Goal status: INITIAL  2.  Pt will report she is able to ambulate extended community distance without significant pain.  Baseline:  Goal status: INITIAL  3.  Pt will report she is ambulating without having to alter her gait due to pain.  Baseline:  Goal status: INITIAL  4.  Pt will report at least a 70% improvement in pain and sx's overall.  Baseline:  Goal status: INITIAL  5.  Pt's worst pain will be no > than 2/10 for improved pain in AM.  Baseline:  Goal status: INITIAL    PLAN:  PT FREQUENCY: 2x/week  PT DURATION: 6 weeks  PLANNED INTERVENTIONS: 97164- PT Re-evaluation, 97110-Therapeutic exercises, 97530- Therapeutic activity, [id number]- Neuromuscular re-education, 97535- Self Care, 16109- Manual therapy, [id number]- Gait training, [phone]- Aquatic Therapy, [phone]- Electrical stimulation (unattended), [id number]- Electrical stimulation (manual), [id number]- Ultrasound, 91478- Ionotophoresis 4mg /ml Dexamethasone, Patient/Family education, Stair training, Taping, Dry Needling, Joint mobilization, Cryotherapy, and Moist  heat  PLAN FOR NEXT SESSION: STM/IASTM to plantar fascia.  Foot intrinsic strengthening.    Audie Clear III PT, DPT 09/04/23 7:57 PM

## 2023-09-07 ENCOUNTER — Encounter (HOSPITAL_BASED_OUTPATIENT_CLINIC_OR_DEPARTMENT_OTHER): Payer: Self-pay | Admitting: Physical Therapy

## 2023-09-07 ENCOUNTER — Ambulatory Visit (HOSPITAL_BASED_OUTPATIENT_CLINIC_OR_DEPARTMENT_OTHER): Admitting: Physical Therapy

## 2023-09-07 DIAGNOSIS — M79671 Pain in right foot: Secondary | ICD-10-CM

## 2023-09-07 DIAGNOSIS — R262 Difficulty in walking, not elsewhere classified: Secondary | ICD-10-CM

## 2023-09-07 NOTE — Patient Instructions (Signed)

## 2023-09-07 NOTE — Therapy (Signed)
 OUTPATIENT PHYSICAL THERAPY LOWER EXTREMITY TREATMENT   Patient Name: Jarissa Sheriff MRN: [id number] DOB:12-15-1970, 53 y.o., female Today's Date: 09/07/2023  END OF SESSION:  PT End of Session - 09/07/23 1053     Visit Number 7    Number of Visits 12    Date for PT Re-Evaluation 09/11/23    Authorization Type BCBS    PT Start Time 1055    PT Stop Time 1130    PT Time Calculation (min) 35 min    Activity Tolerance Patient tolerated treatment well    Behavior During Therapy WFL for tasks assessed/performed                 Past Medical History:  Diagnosis Date   HTN (hypertension)    Past Surgical History:  Procedure Laterality Date   CESAREAN SECTION  2006   CHOLECYSTECTOMY  05/16/2017   Patient Active Problem List   Diagnosis Date Noted   Other long term (current) drug therapy 04/16/2022   Plantar fasciitis of right foot 03/05/2022   Rotator cuff tendonitis, right 03/05/2022   Umbilical hernia 03/05/2022   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   Laryngopharyngeal reflux (LPR) 03/03/2019   Neck pain 03/03/2019   Referred otalgia of right ear 03/03/2019   Shortness of breath 03/03/2019   Essential hypertension 12/09/2018   GERD (gastroesophageal reflux disease) 12/09/2018   Hyperlipidemia 12/09/2018   Chronic constipation 01/28/2018   Overweight (BMI 25.0-29.9) 01/28/2018   S/P laparoscopic cholecystectomy 05/16/2017   Celiac disease 08/08/2011   Depression 08/08/2011   Hypothyroid 06/27/2011    PCP: Maryln Sober, PA-C  REFERRING PROVIDER: Charity Conch, DPM   REFERRING DIAG: M72.2 (ICD-10-CM) - Plantar fasciitis   THERAPY DIAG:  Pain in right foot  Difficulty in walking, not elsewhere classified  Rationale for Evaluation and Treatment: Rehabilitation  ONSET DATE: 3 year hx of pain / 07/24/23 PT order  SUBJECTIVE:   SUBJECTIVE STATEMENT: Pt states she had a bad weekend.  She had increased pain this past  weekend.  Pt decreased her walking yesterday which helped.  She has throbbing some nights when she goes to bed.  Pt reports 3/10 pain currently at rest though increases when she walks.  Pt denies any adverse effects after prior Rx.     PERTINENT HISTORY: Chronic plantar foot pain HTN Celiac disease  PAIN:  Are you having pain? Yes NPRS:  3/10 current, 8/10 worst, 1-2/10 best Location:  t/o R heel including inf, post, med, and lat though worse at inferior heel Type:  stabbing, dull ache Easing factors:  orthotics, heating pad at night, stretching Aggravating Factors:  prolonged standing, worst pain 1st thing in AM, ambulation  PRECAUTIONS: None    WEIGHT BEARING RESTRICTIONS: No  FALLS:  Has patient fallen in last 6 months? 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  DIAGNOSTIC FINDINGS:  R ankle MRI on 07/12/23: IMPRESSION: 1. Plantar fasciitis, with thickening of the medial band of the plantar fascia, surrounding edema, and edema in the adjacent plantar calcaneal spur. 2. Common peroneus tendon sheath tenosynovitis. 3. Distal tibialis posterior tenosynovitis and tendinopathy. 4. Trace edema in the upper portion of Kager's fat pad but the adjacent Achilles tendon appears normal.  X rays of R foot per MD note: No evidence of acute fracture. Posterior calcaneal spurring is noted.                                                                                                                                TREATMENT DATE:   09/07/2023: Trigger Point Dry Needling  Initial Treatment: Pt instructed on Dry Needling rational, procedures, and possible side effects. Pt instructed to expect mild to  moderate muscle soreness later in the day and/or into the next day.  Pt instructed in methods to reduce muscle soreness. Pt instructed to continue prescribed HEP.  Patient Verbal Consent Given: Yes Education Handout Provided: Previously Provided Muscles Treated: L gastroc  Electrical Stimulation Performed: Yes, Parameters: low amplitude, low intensity.  Treatment Response/Outcome: reduced tension.  Manual:   - posterior ankle mobs   - manual ankle pumps with pin and stretch   - Assessed ankle ROM and great toe mobility  There ex:   - Sidelying on shuttle with 100lbs SL 2x10  - Retrowalking with focus on HS stretch    Neuro:    - Sidelying clams with BTB 2x10   - Retro walking with cable 50lbs x8  - Toe yoga  - Towel scrunches     Therapeutic Exercise:   Reviewed pt presentation, pain level, and response to prior Rx. Rolling of plantar fascia x 4 mins Standing plantar fascia stretch with foot on wall 2x30 seconds.    Marble pick up (10) x 4 reps Standing wall calf stretch 2x30 sec  Neuro Re-ed Actities: Lateral Step ups on airex approx 12-15 x 2 reps Tandem stance on airex 2x30 sec each Marching on airex 2x10 BAPS board 2x10 f/b, cw, and ccw  Manual therapy: Pt received rolling to R calf in prone and STM to R plantar fasica in longsitting to improve soft tissue tightness and pain and reduce myofascial adhesions and restrictions.   PATIENT EDUCATION:  Education details:  HEP, POC, dx, rationale of interventions, exercise form, and relevant anatomy.  PT answered pt's questions.  Person educated: Patient Education method: Explanation, Demonstration, Verbal cues Education comprehension: verbalized understanding, returned demonstration, and verbal cues required  HOME EXERCISE PROGRAM: Access Code: [email or web address] URL: [email or web address] Date: 07/31/2023 Prepared by: Marnie Siren  Exercises - Seated Plantar Fascia Stretch  - 2 x daily - 7 x weekly - 2-3 reps  - 20-30 seconds hold - Seated Plantar Fascia Mobilization with Small Ball  - 7 x weekly - Gastroc Stretch on Wall  - 2 x daily - 7 x weekly - 2-3 reps - 20-30 seconds hold - Seated Toe Towel Scrunches  - 1 x daily - 7 x weekly - 1-2 sets - 5 reps  ASSESSMENT:  CLINICAL IMPRESSION: Appt truncated due to pt's late arrival. Pt reports no changes in symptoms today. Did general review of foot, ankle toe mobility. Pt with functional range with no issues with muscle fatigue  with prescribed exercises. Further assessment of upper chain presents weakness in glutes. Challenged pt with glute strengthening today with fatigue reported and increased difficulty with eccentric control noted. TPDN with estim with pt reporting minimal change but feeling muscle twitches during. No adverse effects. PT performed manual techniques to improve soft tissue tightness in calf. Pt responded well to Rx reporting no increased pain and states she feels a little better after Rx.  She should benefit from continued skilled PT to address impairments and goals and improve overall function.       OBJECTIVE IMPAIRMENTS: decreased activity tolerance, difficulty walking, increased fascial restrictions, and pain.   ACTIVITY LIMITATIONS: standing and locomotion level  PARTICIPATION LIMITATIONS: shopping and community activity  PERSONAL FACTORS: Time since onset of injury/illness/exacerbation are also affecting patient's functional outcome.   REHAB POTENTIAL: Good  CLINICAL DECISION MAKING: Stable/uncomplicated  EVALUATION COMPLEXITY: Low   GOALS:   SHORT TERM GOALS: Target date: 08/21/2023  Pt will be independent and compliant with HEP for improved pain, tolerance to activity, soft tissue mobility, strength, and function.  Baseline: Goal status: INITIAL  2.  Pt will report at least a 25% improvement in pain and sx's overall  Baseline:  Goal status: INITIAL  3.  Pt will report improved ambulation distance with reduced  pain.  Baseline:  Goal status: INITIAL   LONG TERM GOALS: Target date: 09/11/2023  Pt will demo improved tightness in plantar fascia for reduced pain and improved mobility.   Baseline:  Goal status: INITIAL  2.  Pt will report she is able to ambulate extended community distance without significant pain.  Baseline:  Goal status: INITIAL  3.  Pt will report she is ambulating without having to alter her gait due to pain.  Baseline:  Goal status: INITIAL  4.  Pt will report at least a 70% improvement in pain and sx's overall.  Baseline:  Goal status: INITIAL  5.  Pt's worst pain will be no > than 2/10 for improved pain in AM.  Baseline:  Goal status: INITIAL    PLAN:  PT FREQUENCY: 2x/week  PT DURATION: 6 weeks  PLANNED INTERVENTIONS: 97164- PT Re-evaluation, 97110-Therapeutic exercises, 97530- Therapeutic activity, [id number]- Neuromuscular re-education, 97535- Self Care, 16109- Manual therapy, [id number]- Gait training, [phone]- Aquatic Therapy, [phone]- Electrical stimulation (unattended), [id number]- Electrical stimulation (manual), [id number]- Ultrasound, 91478- Ionotophoresis 4mg /ml Dexamethasone, Patient/Family education, Stair training, Taping, Dry Needling, Joint mobilization, Cryotherapy, and Moist heat  PLAN FOR NEXT SESSION: STM/IASTM to plantar fascia.  Foot intrinsic strengthening.    Fredia Janus PT, DPT 09/07/23  11:57 AM

## 2023-09-13 ENCOUNTER — Other Ambulatory Visit (HOSPITAL_BASED_OUTPATIENT_CLINIC_OR_DEPARTMENT_OTHER): Payer: Self-pay

## 2023-09-16 ENCOUNTER — Ambulatory Visit (HOSPITAL_BASED_OUTPATIENT_CLINIC_OR_DEPARTMENT_OTHER): Admitting: Physical Therapy

## 2023-09-16 DIAGNOSIS — R262 Difficulty in walking, not elsewhere classified: Secondary | ICD-10-CM

## 2023-09-16 DIAGNOSIS — M79671 Pain in right foot: Secondary | ICD-10-CM | POA: Diagnosis not present

## 2023-09-16 NOTE — Therapy (Signed)
 OUTPATIENT PHYSICAL THERAPY LOWER EXTREMITY TREATMENT / PROGRESS NOTE   Patient Name: Diane Morales MRN: [id number] DOB:[birth year], 53 y.o., female Today's Date: 09/17/2023  END OF SESSION:  PT End of Session - 09/16/23 1542     Visit Number 8    Number of Visits 16    Date for PT Re-Evaluation 10/14/23    Authorization Type BCBS    PT Start Time 1538    PT Stop Time 1621    PT Time Calculation (min) 43 min    Activity Tolerance Patient tolerated treatment well    Behavior During Therapy WFL for tasks assessed/performed                 Past Medical History:  Diagnosis Date   HTN (hypertension)    Past Surgical History:  Procedure Laterality Date   CESAREAN SECTION  2006   CHOLECYSTECTOMY  05/16/2017   Patient Active Problem List   Diagnosis Date Noted   Other long term (current) drug therapy 04/16/2022   Plantar fasciitis of right foot 03/05/2022   Rotator cuff tendonitis, right 03/05/2022   Umbilical hernia 03/05/2022   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   Laryngopharyngeal reflux (LPR) 03/03/2019   Neck pain 03/03/2019   Referred otalgia of right ear 03/03/2019   Shortness of breath 03/03/2019   Essential hypertension 12/09/2018   GERD (gastroesophageal reflux disease) 12/09/2018   Hyperlipidemia 12/09/2018   Chronic constipation 01/28/2018   Overweight (BMI 25.0-29.9) 01/28/2018   S/P laparoscopic cholecystectomy 05/16/2017   Celiac disease 08/08/2011   Depression 08/08/2011   Hypothyroid 06/27/2011    PCP: Maryln Sober, PA-C  REFERRING PROVIDER: Charity Conch, DPM   REFERRING DIAG: M72.2 (ICD-10-CM) - Plantar fasciitis   THERAPY DIAG:  Pain in right foot  Difficulty in walking, not elsewhere classified  Rationale for Evaluation and Treatment: Rehabilitation  ONSET DATE: 3 year hx of pain / 07/24/23 PT order  SUBJECTIVE:   SUBJECTIVE STATEMENT: Pt has been on vacation.  She reports at least a 60-70%  improvement in pain and sx's overall.  Pt states it's less painful for longer lengths of the day.  Pt reports no change in pain with ambulation.  She has decreased her ambulation distance.  Pt does have altered gait with pain.  Pt reports pain 1st in AM is getting a little better.      PERTINENT HISTORY: Chronic plantar foot pain HTN Celiac disease  PAIN:  Are you having pain? Yes NPRS:  2-3/10 current, 6-7/10 worst, 1-2/10 best Location:  t/o R heel including inf, post, med, and lat though worse at inferior heel Type:  stabbing, dull ache Easing factors:  orthotics, heating pad at night, stretching Aggravating Factors:  prolonged standing, worst pain 1st thing in AM, ambulation  PRECAUTIONS: None    WEIGHT BEARING RESTRICTIONS: No  FALLS:  Has patient fallen in last 6 months? 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  DIAGNOSTIC FINDINGS:  R ankle MRI on 07/12/23: IMPRESSION: 1. Plantar fasciitis, with thickening of the medial band of the plantar fascia, surrounding edema, and edema in the adjacent plantar calcaneal spur. 2. Common peroneus tendon sheath tenosynovitis. 3. Distal tibialis posterior tenosynovitis and tendinopathy. 4. Trace edema in  the upper portion of Kager's fat pad but the adjacent Achilles tendon appears normal.  X rays of R foot per MD note: No evidence of acute fracture. Posterior calcaneal spurring is noted.                                                                                                                                TREATMENT DATE:   4/21 Gait:  Pt ambulates with a normalized heel to toe gait without limping.    R ankle AROM: DF:  18 Eve:  20  Pt  has soft tissue tightness in plantar fascia especially in medial plantar fascia.   Manual Therapy: STM/IASTM to R plantar fascia to improve soft tissue tightness and mobility and reduce pain and myofascials restrictions.    BAPS board 2x10 f/b, cw, and ccw Marching on airex 2x10 Lateral step ups on airex with hold 2x10 Sidelying clams with BTB 2x10   PATIENT SURVEYS:  LEFS:  Initial/current: 57/80 / 63/80   09/07/2023: Trigger Point Dry Needling  Initial Treatment: Pt instructed on Dry Needling rational, procedures, and possible side effects. Pt instructed to expect mild to moderate muscle soreness later in the day and/or into the next day.  Pt instructed in methods to reduce muscle soreness. Pt instructed to continue prescribed HEP.  Patient Verbal Consent Given: Yes Education Handout Provided: Previously Provided Muscles Treated: L gastroc  Electrical Stimulation Performed: Yes, Parameters: low amplitude, low intensity.  Treatment Response/Outcome: reduced tension.  Manual:   - posterior ankle mobs   - manual ankle pumps with pin and stretch   - Assessed ankle ROM and great toe mobility  There ex:   - Sidelying on shuttle with 100lbs SL 2x10  - Retrowalking with focus on HS stretch    Neuro:    - Sidelying clams with BTB 2x10   - Retro walking with cable 50lbs x8  - Toe yoga  - Towel scrunches     Therapeutic Exercise:   Reviewed pt presentation, pain level, and response to prior Rx. Rolling of plantar fascia x 4 mins Standing plantar fascia stretch with foot on wall 2x30 seconds.    Marble pick up (10) x 4 reps Standing wall calf stretch 2x30 sec  Neuro Re-ed Actities: Lateral Step ups on airex approx 12-15 x 2 reps Tandem stance on airex 2x30 sec each Marching on airex 2x10 BAPS board 2x10 f/b, cw, and ccw  Manual therapy: Pt received rolling to R calf in prone and STM to R plantar fasica in longsitting to improve soft tissue tightness and pain and reduce  myofascial adhesions and restrictions.   PATIENT EDUCATION:  Education details:  HEP, POC, dx, rationale of interventions, objective findings, goal progress, exercise form, and relevant anatomy.  PT answered pt's questions.  Person educated: Patient Education method: Explanation, Demonstration, Verbal cues Education comprehension: verbalized understanding, returned demonstration, and verbal cues required  HOME EXERCISE PROGRAM: Access Code: [email or web address] URL:  [email or web address] Date: 07/31/2023 Prepared by: Marnie Siren  Exercises - Seated Plantar Fascia Stretch  - 2 x daily - 7 x weekly - 2-3 reps - 20-30 seconds hold - Seated Plantar Fascia Mobilization with Small Ball  - 7 x weekly - Gastroc Stretch on Wall  - 2 x daily - 7 x weekly - 2-3 reps - 20-30 seconds hold - Seated Toe Towel Scrunches  - 1 x daily - 7 x weekly - 1-2 sets - 5 reps  ASSESSMENT:  CLINICAL IMPRESSION: Though pt continues to have pain, she is making progress.  She reports at least a 60-70% improvement in pain and sx's overall.  She has no change in pain with ambulation and has decreased her ambulation distance.  Pain alters her gait.  The pain 1st thing in AM is getting a little better.  Pt demonstrated a 1 deg improvement in R ankle DF AROM which equals L LE.  Pt demonstrates a 6 deg improvement in ankle eversion AROM which equals L LE.  Pt has soft tissue tightness in calf and continues to have tightness in plantar fascia.  Pt has met STG's #1,2 and partially met LTG #4.  PT educated pt concerning POC and pt verbalizes agreement with POC.  Pt should benefit from cont skilled PT to address ongoing goals and impairments and to assist with restoring desired level of function.     OBJECTIVE IMPAIRMENTS: decreased activity tolerance, difficulty walking, increased fascial restrictions, and pain.   ACTIVITY LIMITATIONS: standing and locomotion level  PARTICIPATION LIMITATIONS: shopping and community  activity  PERSONAL FACTORS: Time since onset of injury/illness/exacerbation are also affecting patient's functional outcome.   REHAB POTENTIAL: Good  CLINICAL DECISION MAKING: Stable/uncomplicated  EVALUATION COMPLEXITY: Low   GOALS:   SHORT TERM GOALS: Target date: 08/21/2023  Pt will be independent and compliant with HEP for improved pain, tolerance to activity, soft tissue mobility, strength, and function.  Baseline: Goal status: GOAL MET    2.  Pt will report at least a 25% improvement in pain and sx's overall  Baseline:  Goal status:  GOAL MET  4/21  3.  Pt will report improved ambulation distance with reduced pain.  Baseline:  Goal status:  NOT MET      LONG TERM GOALS: Target date: 10/14/2023  Pt will demo improved tightness in plantar fascia for reduced pain and improved mobility.   Baseline:  Goal status: ONGOING  2.  Pt will report she is able to ambulate extended community distance without significant pain.  Baseline:  Goal status:  NOT MET  3.  Pt will report she is ambulating without having to alter her gait due to pain.  Baseline:  Goal status: NOT MET  4.  Pt will report at least a 70% improvement in pain and sx's overall.  Baseline:  Goal status: 95% MET   4/21  5.  Pt's worst pain will be no > than 2/10 for improved pain in AM.  Baseline:  Goal status: NOT MET    PLAN:  PT FREQUENCY: 2x/week  PT DURATION:   4 weeks  PLANNED INTERVENTIONS: 97164- PT Re-evaluation, 97110-Therapeutic exercises, 97530- Therapeutic activity, 97112- Neuromuscular re-education, 97535- Self Care, 96295- Manual therapy, [id number]- Gait training, [phone]- Aquatic Therapy, [phone]- Electrical stimulation (unattended), [phone]- Electrical stimulation (manual), [id number]- Ultrasound, 36644- Ionotophoresis 4mg /ml Dexamethasone, Patient/Family education, Stair training, Taping, Dry Needling, Joint mobilization, Cryotherapy, and Moist heat  PLAN FOR NEXT SESSION:  Cont with soft tissue  work to plantar fascia and  calf.  Cont with foot intrinsic strengthening, plantar fascia and calf flexibility, and proprioceptive activities.   Trina Fujita III PT, DPT 09/17/23 9:33 PM

## 2023-09-17 ENCOUNTER — Encounter (HOSPITAL_BASED_OUTPATIENT_CLINIC_OR_DEPARTMENT_OTHER): Payer: Self-pay | Admitting: Physical Therapy

## 2023-09-19 ENCOUNTER — Ambulatory Visit (HOSPITAL_BASED_OUTPATIENT_CLINIC_OR_DEPARTMENT_OTHER): Admitting: Physical Therapy

## 2023-09-19 DIAGNOSIS — M79671 Pain in right foot: Secondary | ICD-10-CM | POA: Diagnosis not present

## 2023-09-19 DIAGNOSIS — R262 Difficulty in walking, not elsewhere classified: Secondary | ICD-10-CM

## 2023-09-19 NOTE — Therapy (Signed)
 OUTPATIENT PHYSICAL THERAPY LOWER EXTREMITY TREATMENT   Patient Name: Diane Morales MRN: [id number] DOB:28-Feb-1971, 53 y.o., female Today's Date: 09/20/2023  END OF SESSION:  PT End of Session - 09/19/23 1623     Visit Number 9    Number of Visits 16    Date for PT Re-Evaluation 10/14/23    Authorization Type BCBS    PT Start Time 1538    PT Stop Time 1620    PT Time Calculation (min) 42 min    Activity Tolerance Patient tolerated treatment well    Behavior During Therapy WFL for tasks assessed/performed                  Past Medical History:  Diagnosis Date   HTN (hypertension)    Past Surgical History:  Procedure Laterality Date   CESAREAN SECTION  2006   CHOLECYSTECTOMY  05/16/2017   Patient Active Problem List   Diagnosis Date Noted   Other long term (current) drug therapy 04/16/2022   Plantar fasciitis of right foot 03/05/2022   Rotator cuff tendonitis, right 03/05/2022   Umbilical hernia 03/05/2022   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   Laryngopharyngeal reflux (LPR) 03/03/2019   Neck pain 03/03/2019   Referred otalgia of right ear 03/03/2019   Shortness of breath 03/03/2019   Essential hypertension 12/09/2018   GERD (gastroesophageal reflux disease) 12/09/2018   Hyperlipidemia 12/09/2018   Chronic constipation 01/28/2018   Overweight (BMI 25.0-29.9) 01/28/2018   S/P laparoscopic cholecystectomy 05/16/2017   Celiac disease 08/08/2011   Depression 08/08/2011   Hypothyroid 06/27/2011    PCP: Maryln Sober, PA-C  REFERRING PROVIDER: Charity Conch, DPM   REFERRING DIAG: M72.2 (ICD-10-CM) - Plantar fasciitis   THERAPY DIAG:  Pain in right foot  Difficulty in walking, not elsewhere classified  Rationale for Evaluation and Treatment: Rehabilitation  ONSET DATE: 3 year hx of pain / 07/24/23 PT order  SUBJECTIVE:   SUBJECTIVE STATEMENT: Pt states she felt like she worked it last Rx though had no increased  pain.  Pt states it hurts worse sitting.       PERTINENT HISTORY: Chronic plantar foot pain HTN Celiac disease  PAIN:  Are you having pain? Yes NPRS:  4/10 current, 6-7/10 worst, 1-2/10 best Location:  medial R plantar  Type:  annoying, stapping Easing factors:  orthotics, heating pad at night, stretching Aggravating Factors:  prolonged standing, worst pain 1st thing in AM, ambulation  PRECAUTIONS: None    WEIGHT BEARING RESTRICTIONS: No  FALLS:  Has patient fallen in last 6 months? 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  DIAGNOSTIC FINDINGS:  R ankle MRI on 07/12/23: IMPRESSION: 1. Plantar fasciitis, with thickening of the medial band of the plantar fascia, surrounding edema, and edema in the adjacent plantar calcaneal spur. 2. Common peroneus tendon sheath tenosynovitis. 3. Distal tibialis posterior tenosynovitis and tendinopathy. 4. Trace edema in the upper portion of Kager's fat pad but the adjacent Achilles tendon appears normal.  X rays of R foot per MD note: No evidence of acute fracture. Posterior calcaneal spurring is noted.  TREATMENT DATE:   4/24  BAPS board 2x10 f/b, cw, and ccw Towel scrunches Toe yoga 2x10  Step up and hold on airex 2x10 fwd and 1x10 lateral Standing gastroc stretch on wall 2x30 sec biat Standing soleus stretch on wall 2x30 sec bilat  Manual Therapy:  Pt received rolling to R calf with the roller in prone and IASTM to R plantar fasica in longsitting to improve soft tissue tightness and pain and reduce myofascial adhesions and  restrictions.     4/21 Gait:  Pt ambulates with a normalized heel to toe gait without limping.    R ankle AROM: DF:  18 Eve:  20  Pt has soft tissue tightness in plantar fascia especially in medial plantar fascia.   Manual Therapy: STM/IASTM to R plantar fascia to improve soft tissue tightness and mobility and reduce pain and myofascials restrictions.    BAPS board 2x10 f/b, cw, and ccw Marching on airex 2x10 Lateral step ups on airex with hold 2x10 Sidelying clams with BTB 2x10   PATIENT SURVEYS:  LEFS:  Initial/current: 57/80 / 63/80   09/07/2023: Trigger Point Dry Needling  Initial Treatment: Pt instructed on Dry Needling rational, procedures, and possible side effects. Pt instructed to expect mild to moderate muscle soreness later in the day and/or into the next day.  Pt instructed in methods to reduce muscle soreness. Pt instructed to continue prescribed HEP.  Patient Verbal Consent Given: Yes Education Handout Provided: Previously Provided Muscles Treated: L gastroc  Electrical Stimulation Performed: Yes, Parameters: low amplitude, low intensity.  Treatment Response/Outcome: reduced tension.  Manual:   - posterior ankle mobs   - manual ankle pumps with pin and stretch   - Assessed ankle ROM and great toe mobility  There ex:   - Sidelying on shuttle with 100lbs SL 2x10  - Retrowalking with focus on HS stretch    Neuro:    - Sidelying clams with BTB 2x10   - Retro walking with cable 50lbs x8  - Toe yoga  - Towel scrunches     Therapeutic Exercise:   Reviewed pt presentation, pain level, and response to prior Rx. Rolling of plantar fascia x 4 mins Standing plantar fascia stretch with foot on wall 2x30 seconds.    Marble pick up (10) x 4 reps Standing wall calf stretch 2x30 sec  Neuro Re-ed Actities: Lateral Step ups on airex approx 12-15 x 2 reps Tandem stance on airex 2x30 sec each Marching on airex 2x10 BAPS board 2x10 f/b, cw, and ccw  Manual  therapy: Pt received rolling to R calf in prone and STM to R plantar fasica in longsitting to improve soft tissue tightness and pain and reduce myofascial adhesions and restrictions.   PATIENT EDUCATION:  Education details:  HEP, POC, dx, rationale of interventions, objective findings, goal progress, exercise form, and relevant anatomy.  PT answered pt's questions.  Person educated: Patient Education method: Explanation, Demonstration, Verbal cues Education comprehension: verbalized understanding, returned demonstration, and verbal cues required  HOME EXERCISE PROGRAM: Access Code: [email or web address] URL: [email or web address] Date: 07/31/2023 Prepared by: Marnie Siren  Exercises - Seated Plantar Fascia Stretch  - 2 x daily - 7 x weekly - 2-3 reps - 20-30 seconds hold - Seated Plantar Fascia Mobilization with Small Ball  - 7 x weekly - Gastroc Stretch on Wall  - 2 x daily - 7 x weekly - 2-3 reps - 20-30 seconds hold - Seated Toe Towel Scrunches  - 1 x daily - 7 x weekly -  1-2 sets - 5 reps  ASSESSMENT:  CLINICAL IMPRESSION: PT instructed pt in therapeutic exercise  and neuro re-ed activities to improve foot intrinsic strength, calf flexibility, and LE proprioception.  Pt performed exercises well with cuing and instruction in correct form.  Pt continues to have soft tissue tightness in calf and in plantar fascia.  The tightness in her plantar fascia is located more medially.  PT performed rolling and IASTM to improve soft tissue tightness and mobility.  She responded well to Rx having no c/o's after Rx.  Pt should benefit from cont skilled PT to address ongoing goals and impairments and to assist with restoring desired level of function.     OBJECTIVE IMPAIRMENTS: decreased activity tolerance, difficulty walking, increased fascial restrictions, and pain.   ACTIVITY LIMITATIONS: standing and locomotion level  PARTICIPATION LIMITATIONS: shopping and community activity  PERSONAL  FACTORS: Time since onset of injury/illness/exacerbation are also affecting patient's functional outcome.   REHAB POTENTIAL: Good  CLINICAL DECISION MAKING: Stable/uncomplicated  EVALUATION COMPLEXITY: Low   GOALS:   SHORT TERM GOALS: Target date: 08/21/2023  Pt will be independent and compliant with HEP for improved pain, tolerance to activity, soft tissue mobility, strength, and function.  Baseline: Goal status: GOAL MET    2.  Pt will report at least a 25% improvement in pain and sx's overall  Baseline:  Goal status:  GOAL MET  4/21  3.  Pt will report improved ambulation distance with reduced pain.  Baseline:  Goal status:  NOT MET      LONG TERM GOALS: Target date: 10/14/2023  Pt will demo improved tightness in plantar fascia for reduced pain and improved mobility.   Baseline:  Goal status: ONGOING  2.  Pt will report she is able to ambulate extended community distance without significant pain.  Baseline:  Goal status:  NOT MET  3.  Pt will report she is ambulating without having to alter her gait due to pain.  Baseline:  Goal status: NOT MET  4.  Pt will report at least a 70% improvement in pain and sx's overall.  Baseline:  Goal status: 95% MET   4/21  5.  Pt's worst pain will be no > than 2/10 for improved pain in AM.  Baseline:  Goal status: NOT MET    PLAN:  PT FREQUENCY: 2x/week  PT DURATION:   4 weeks  PLANNED INTERVENTIONS: 97164- PT Re-evaluation, 97110-Therapeutic exercises, 97530- Therapeutic activity, 97112- Neuromuscular re-education, 97535- Self Care, 16109- Manual therapy, [id number]- Gait training, [phone]- Aquatic Therapy, [phone]- Electrical stimulation (unattended), [phone]- Electrical stimulation (manual), [id number]- Ultrasound, 29562- Ionotophoresis 4mg /ml Dexamethasone, Patient/Family education, Stair training, Taping, Dry Needling, Joint mobilization, Cryotherapy, and Moist heat  PLAN FOR NEXT SESSION:  Cont with soft tissue work to plantar fascia  and calf.  Cont with foot intrinsic strengthening, plantar fascia and calf flexibility, and proprioceptive activities.   Trina Fujita III PT, DPT 09/20/23 9:59 PM

## 2023-09-20 ENCOUNTER — Encounter (HOSPITAL_BASED_OUTPATIENT_CLINIC_OR_DEPARTMENT_OTHER): Payer: Self-pay | Admitting: Physical Therapy

## 2023-09-20 ENCOUNTER — Other Ambulatory Visit (HOSPITAL_BASED_OUTPATIENT_CLINIC_OR_DEPARTMENT_OTHER): Payer: Self-pay

## 2023-09-23 ENCOUNTER — Encounter (HOSPITAL_BASED_OUTPATIENT_CLINIC_OR_DEPARTMENT_OTHER): Payer: Self-pay

## 2023-09-23 ENCOUNTER — Ambulatory Visit (HOSPITAL_BASED_OUTPATIENT_CLINIC_OR_DEPARTMENT_OTHER)

## 2023-09-23 ENCOUNTER — Other Ambulatory Visit (HOSPITAL_BASED_OUTPATIENT_CLINIC_OR_DEPARTMENT_OTHER): Payer: Self-pay

## 2023-09-23 DIAGNOSIS — M79671 Pain in right foot: Secondary | ICD-10-CM

## 2023-09-23 DIAGNOSIS — R262 Difficulty in walking, not elsewhere classified: Secondary | ICD-10-CM

## 2023-09-23 NOTE — Therapy (Signed)
 OUTPATIENT PHYSICAL THERAPY LOWER EXTREMITY TREATMENT   Patient Name: Diane Morales MRN: [id number] DOB:November 15, 1970, 53 y.o., female Today's Date: 09/23/2023  END OF SESSION:  PT End of Session - 09/23/23 1609     Visit Number 10    Number of Visits 16    Date for PT Re-Evaluation 10/14/23    Authorization Type BCBS    PT Start Time 1607    PT Stop Time 1645    PT Time Calculation (min) 38 min    Activity Tolerance Patient tolerated treatment well    Behavior During Therapy WFL for tasks assessed/performed                   Past Medical History:  Diagnosis Date   HTN (hypertension)    Past Surgical History:  Procedure Laterality Date   CESAREAN SECTION  2006   CHOLECYSTECTOMY  05/16/2017   Patient Active Problem List   Diagnosis Date Noted   Other long term (current) drug therapy 04/16/2022   Plantar fasciitis of right foot 03/05/2022   Rotator cuff tendonitis, right 03/05/2022   Umbilical hernia 03/05/2022   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   Laryngopharyngeal reflux (LPR) 03/03/2019   Neck pain 03/03/2019   Referred otalgia of right ear 03/03/2019   Shortness of breath 03/03/2019   Essential hypertension 12/09/2018   GERD (gastroesophageal reflux disease) 12/09/2018   Hyperlipidemia 12/09/2018   Chronic constipation 01/28/2018   Overweight (BMI 25.0-29.9) 01/28/2018   S/P laparoscopic cholecystectomy 05/16/2017   Celiac disease 08/08/2011   Depression 08/08/2011   Hypothyroid 06/27/2011    PCP: Maryln Sober, PA-C  REFERRING PROVIDER: Charity Conch, DPM   REFERRING DIAG: M72.2 (ICD-10-CM) - Plantar fasciitis   THERAPY DIAG:  Pain in right foot  Difficulty in walking, not elsewhere classified  Rationale for Evaluation and Treatment: Rehabilitation  ONSET DATE: 3 year hx of pain / 07/24/23 PT order  SUBJECTIVE:   SUBJECTIVE STATEMENT: Pt reports she had increased pain in plantar fascia after overdoing  yesterday. 2/10 pain level at entry.      PERTINENT HISTORY: Chronic plantar foot pain HTN Celiac disease  PAIN:  Are you having pain? Yes NPRS:  2/10 current, 6-7/10 worst, 1-2/10 best Location:  medial R plantar  Type:  annoying, stapping Easing factors:  orthotics, heating pad at night, stretching Aggravating Factors:  prolonged standing, worst pain 1st thing in AM, ambulation  PRECAUTIONS: None    WEIGHT BEARING RESTRICTIONS: No  FALLS:  Has patient fallen in last 6 months? 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  DIAGNOSTIC FINDINGS:  R ankle MRI on 07/12/23: IMPRESSION: 1. Plantar fasciitis, with thickening of the medial band of the plantar fascia, surrounding edema, and edema in the adjacent plantar calcaneal spur. 2. Common peroneus tendon sheath tenosynovitis. 3. Distal tibialis posterior tenosynovitis and tendinopathy. 4. Trace edema in the upper portion of Kager's fat pad but the adjacent Achilles tendon appears normal.  X rays of R foot per MD note: No evidence of acute fracture. Posterior calcaneal spurring is noted.  TREATMENT DATE:    4/28 STM to plantar fascia Manual plantar fascia stretching Prone DF stretching IASTM using roller to gastroc in prone  Seated ankle circles x 15 each Slant board right only 30 seconds x 3  4/24  BAPS board 2x10 f/b, cw, and ccw Towel scrunches Toe yoga 2x10  Step up and hold on airex 2x10 fwd and 1x10 lateral Standing gastroc stretch on wall 2x30 sec biat Standing soleus stretch on wall 2x30 sec  bilat  Manual Therapy:  Pt received rolling to R calf with the roller in prone and IASTM to R plantar fasica in longsitting to improve soft tissue tightness and pain and reduce myofascial adhesions and restrictions.     4/21 Gait:  Pt ambulates with a normalized heel to toe gait without limping.    R ankle AROM: DF:  18 Eve:  20  Pt has soft tissue tightness in plantar fascia especially in medial plantar fascia.   Manual Therapy: STM/IASTM to R plantar fascia to improve soft tissue tightness and mobility and reduce pain and myofascials restrictions.    BAPS board 2x10 f/b, cw, and ccw Marching on airex 2x10 Lateral step ups on airex with hold 2x10 Sidelying clams with BTB 2x10   PATIENT SURVEYS:  LEFS:  Initial/current: 57/80 / 63/80   09/07/2023: Trigger Point Dry Needling  Initial Treatment: Pt instructed on Dry Needling rational, procedures, and possible side effects. Pt instructed to expect mild to moderate muscle soreness later in the day and/or into the next day.  Pt instructed in methods to reduce muscle soreness. Pt instructed to continue prescribed HEP.  Patient Verbal Consent Given: Yes Education Handout Provided: Previously Provided Muscles Treated: L gastroc  Electrical Stimulation Performed: Yes, Parameters: low amplitude, low intensity.  Treatment Response/Outcome: reduced tension.  Manual:   - posterior ankle mobs   - manual ankle pumps with pin and stretch   - Assessed ankle ROM and great toe mobility  There ex:   - Sidelying on shuttle with 100lbs SL 2x10  - Retrowalking with focus on HS stretch    Neuro:    - Sidelying clams with BTB 2x10   - Retro walking with cable 50lbs x8  - Toe yoga  - Towel scrunches     Therapeutic Exercise:   Reviewed pt presentation, pain level, and response to prior Rx. Rolling of plantar fascia x 4 mins Standing plantar fascia stretch with foot on wall 2x30 seconds.    [place] pick up (10) x 4 reps Standing  wall calf stretch 2x30 sec  Neuro Re-ed Actities: Lateral Step ups on airex approx 12-15 x 2 reps Tandem stance on airex 2x30 sec each Marching on airex 2x10 BAPS board 2x10 f/b, cw, and ccw  Manual therapy: Pt received rolling to R calf in prone and STM to R plantar fasica in longsitting to improve soft tissue tightness and pain and reduce myofascial adhesions and restrictions.   PATIENT EDUCATION:  Education details:  HEP, POC, dx, rationale of interventions, objective findings, goal progress, exercise form, and relevant anatomy.  PT answered pt's questions.  Person educated: Patient Education method: Explanation, Demonstration, Verbal cues Education comprehension: verbalized understanding, returned demonstration, and verbal cues required  HOME EXERCISE PROGRAM: Access Code: [email or web address] URL: [email or web address] Date: 07/31/2023 Prepared by: Marnie Siren  Exercises - Seated Plantar Fascia Stretch  - 2 x daily - 7 x weekly - 2-3 reps - 20-30 seconds hold - Seated Plantar Fascia Mobilization with Small Ball  - 7 x  weekly - Gastroc Stretch on Wall  - 2 x daily - 7 x weekly - 2-3 reps - 20-30 seconds hold - Seated Toe Towel Scrunches  - 1 x daily - 7 x weekly - 1-2 sets - 5 reps  ASSESSMENT:  CLINICAL IMPRESSION: Focused on manual interventions today to decrease restrictions of plantar fascia.  Restrictions were palpably reduced following STM.  Instructed patient in use of ice and self STM/IASTM at home to improve symptoms.  Good tolerance for slant board stretching.  Instructed patient to ice plantar fascia arriving home following PT session today.  Will continue to monitor pain level and progress as tolerated.  ACTIVITY LIMITATIONS: standing and locomotion level  PARTICIPATION LIMITATIONS: shopping and community activity  PERSONAL FACTORS: Time since onset of injury/illness/exacerbation are also affecting patient's functional outcome.   REHAB POTENTIAL:  Good  CLINICAL DECISION MAKING: Stable/uncomplicated  EVALUATION COMPLEXITY: Low   GOALS:   SHORT TERM GOALS: Target date: 08/21/2023  Pt will be independent and compliant with HEP for improved pain, tolerance to activity, soft tissue mobility, strength, and function.  Baseline: Goal status: GOAL MET    2.  Pt will report at least a 25% improvement in pain and sx's overall  Baseline:  Goal status:  GOAL MET  4/21  3.  Pt will report improved ambulation distance with reduced pain.  Baseline:  Goal status:  NOT MET      LONG TERM GOALS: Target date: 10/14/2023  Pt will demo improved tightness in plantar fascia for reduced pain and improved mobility.   Baseline:  Goal status: ONGOING  2.  Pt will report she is able to ambulate extended community distance without significant pain.  Baseline:  Goal status:  NOT MET  3.  Pt will report she is ambulating without having to alter her gait due to pain.  Baseline:  Goal status: NOT MET  4.  Pt will report at least a 70% improvement in pain and sx's overall.  Baseline:  Goal status: 95% MET   4/21  5.  Pt's worst pain will be no > than 2/10 for improved pain in AM.  Baseline:  Goal status: NOT MET    PLAN:  PT FREQUENCY: 2x/week  PT DURATION:   4 weeks  PLANNED INTERVENTIONS: 97164- PT Re-evaluation, 97110-Therapeutic exercises, 97530- Therapeutic activity, [id number]- Neuromuscular re-education, 97535- Self Care, 13086- Manual therapy, [id number]- Gait training, [phone]- Aquatic Therapy, [phone]- Electrical stimulation (unattended), [phone]- Electrical stimulation (manual), [id number]- Ultrasound, 24401- Ionotophoresis 4mg /ml Dexamethasone, Patient/Family education, Stair training, Taping, Dry Needling, Joint mobilization, Cryotherapy, and Moist heat  PLAN FOR NEXT SESSION:  Cont with soft tissue work to plantar fascia and calf.  Cont with foot intrinsic strengthening, plantar fascia and calf flexibility, and proprioceptive activities.    Herb Loges, PTA

## 2023-09-24 ENCOUNTER — Other Ambulatory Visit (HOSPITAL_BASED_OUTPATIENT_CLINIC_OR_DEPARTMENT_OTHER): Payer: Self-pay

## 2023-09-24 MED ORDER — TRAMADOL HCL 50 MG PO TABS
50.0000 mg | ORAL_TABLET | Freq: Three times a day (TID) | ORAL | 0 refills | Status: AC | PRN
Start: 2023-09-24 — End: ?
  Filled 2023-09-24: qty 21, 7d supply, fill #0

## 2023-09-26 ENCOUNTER — Ambulatory Visit (HOSPITAL_BASED_OUTPATIENT_CLINIC_OR_DEPARTMENT_OTHER): Attending: Podiatry | Admitting: Physical Therapy

## 2023-09-26 ENCOUNTER — Other Ambulatory Visit (HOSPITAL_BASED_OUTPATIENT_CLINIC_OR_DEPARTMENT_OTHER): Payer: Self-pay

## 2023-09-26 DIAGNOSIS — M79671 Pain in right foot: Secondary | ICD-10-CM | POA: Diagnosis present

## 2023-09-26 DIAGNOSIS — R262 Difficulty in walking, not elsewhere classified: Secondary | ICD-10-CM | POA: Diagnosis present

## 2023-09-26 NOTE — Therapy (Signed)
 OUTPATIENT PHYSICAL THERAPY LOWER EXTREMITY TREATMENT   Patient Name: Diane Morales MRN: [id number] DOB:[birth year], 53 y.o., female Today's Date: 09/27/2023  END OF SESSION:  PT End of Session - 09/26/23 1541     Visit Number 11    Number of Visits 16    Date for PT Re-Evaluation 10/14/23    Authorization Type BCBS    PT Start Time 1538    PT Stop Time 1618    PT Time Calculation (min) 40 min    Activity Tolerance Patient tolerated treatment well    Behavior During Therapy WFL for tasks assessed/performed                   Past Medical History:  Diagnosis Date   HTN (hypertension)    Past Surgical History:  Procedure Laterality Date   CESAREAN SECTION  2006   CHOLECYSTECTOMY  05/16/2017   Patient Active Problem List   Diagnosis Date Noted   Other long term (current) drug therapy 04/16/2022   Plantar fasciitis of right foot 03/05/2022   Rotator cuff tendonitis, right 03/05/2022   Umbilical hernia 03/05/2022   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   Laryngopharyngeal reflux (LPR) 03/03/2019   Neck pain 03/03/2019   Referred otalgia of right ear 03/03/2019   Shortness of breath 03/03/2019   Essential hypertension 12/09/2018   GERD (gastroesophageal reflux disease) 12/09/2018   Hyperlipidemia 12/09/2018   Chronic constipation 01/28/2018   Overweight (BMI 25.0-29.9) 01/28/2018   S/P laparoscopic cholecystectomy 05/16/2017   Celiac disease 08/08/2011   Depression 08/08/2011   Hypothyroid 06/27/2011    PCP: Maryln Sober, PA-C  REFERRING PROVIDER: Charity Conch, DPM   REFERRING DIAG: M72.2 (ICD-10-CM) - Plantar fasciitis   THERAPY DIAG:  Pain in right foot  Difficulty in walking, not elsewhere classified  Rationale for Evaluation and Treatment: Rehabilitation  ONSET DATE: 3 year hx of pain / 07/24/23 PT order  SUBJECTIVE:   SUBJECTIVE STATEMENT: Pt states it feels really good.  Pt had a lot of soreness after  prior Rx.  When the soreness went away, she felt great.  Pt has had her daughter work on her plantar fascia this week and it feels better.   She states she has pain with the soft tissue work, but feels much better afterwards.        PERTINENT HISTORY: Chronic plantar foot pain HTN Celiac disease  PAIN:  Are you having pain? Yes NPRS:  0-1/10 current, 6-7/10 worst, 1-2/10 best Location:  medial R plantar  Type:  annoying, stapping Easing factors:  orthotics, heating pad at night, stretching Aggravating Factors:  prolonged standing, worst pain 1st thing in AM, ambulation  PRECAUTIONS: None    WEIGHT BEARING RESTRICTIONS: No  FALLS:  Has patient fallen in last 6 months? 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  DIAGNOSTIC FINDINGS:  R ankle MRI on 07/12/23: IMPRESSION: 1. Plantar fasciitis, with thickening of the medial band of the plantar fascia, surrounding edema, and edema in the adjacent plantar calcaneal spur. 2. Common peroneus tendon sheath tenosynovitis. 3. Distal tibialis posterior tenosynovitis and tendinopathy. 4. Trace edema in the upper portion of Kager's fat pad but the adjacent Achilles tendon appears normal.  X rays  of R foot per MD note: No evidence of acute fracture. Posterior calcaneal spurring is noted.                                                                                                                                TREATMENT DATE:   5/1 Reviewed pt presentation, response to prior Rx, and pain levels.  STM to plantar fascia in longsitting Manual plantar fascia stretching PT used roller and STM with TPR to gastroc in prone  Seated BAPS 2x10 each cw, ccw, and  f/b Standing gastroc stretch 2x30 sec bilat   4/28 STM to plantar fascia Manual plantar fascia stretching Prone DF stretching IASTM using roller to gastroc in prone  Seated ankle circles x 15 each Slant board right only 30 seconds x 3  4/24  BAPS board 2x10 f/b, cw, and ccw Towel scrunches Toe yoga 2x10  Step up and hold on airex 2x10 fwd and 1x10 lateral Standing gastroc stretch on wall 2x30 sec biat Standing soleus stretch on wall 2x30 sec bilat  Manual Therapy:  Pt received rolling to R calf with the roller in prone and IASTM to R plantar fasica in longsitting to improve soft tissue tightness and pain and reduce myofascial adhesions and restrictions.     4/21 Gait:  Pt ambulates with a normalized heel to toe gait without limping.    R ankle AROM: DF:  18 Eve:  20  Pt has soft tissue tightness in plantar fascia especially in medial plantar fascia.   Manual Therapy: STM/IASTM to R plantar fascia to improve soft tissue tightness and mobility and reduce pain and myofascials restrictions.    BAPS board 2x10 f/b, cw, and ccw Marching on airex 2x10 Lateral step ups on airex with hold 2x10 Sidelying clams with BTB 2x10   PATIENT SURVEYS:  LEFS:  Initial/current: 57/80 / 63/80   PATIENT EDUCATION:  Education details:  HEP, POC, dx, rationale of interventions, objective findings, goal progress, exercise form, and relevant anatomy.  PT answered pt's questions.  Person educated: Patient Education method: Explanation, Demonstration, Verbal cues Education comprehension: verbalized understanding, returned demonstration, and verbal cues required  HOME EXERCISE PROGRAM: Access Code: [email or web address] URL: [email or web address] Date: 07/31/2023 Prepared by: Marnie Siren  Exercises - Seated Plantar Fascia Stretch  - 2 x daily - 7 x weekly - 2-3 reps - 20-30 seconds hold - Seated Plantar Fascia Mobilization with Small Ball  - 7 x weekly - Gastroc Stretch on Wall  -  2 x daily - 7 x weekly - 2-3 reps - 20-30 seconds hold - Seated Toe Towel Scrunches  - 1 x daily - 7 x weekly - 1-2 sets - 5 reps  ASSESSMENT:  CLINICAL IMPRESSION: Pt responded well to prior Rx stating she felt really good after the soreness went away.  Pt states she feels better after the soft tissue work.  PT continued to focus on manual interventions to decrease restrictions of  plantar fascia.  Pt has tenderness and tightness in plantar fascia.  Pt has also had her daughter performing some soft tissue work to her plantar fascia.  Pt continues to have soft tissue tightness in L calf and PT performed STM and rolling to L calf.  She performed exercises well after manual therapy.  Pt responded well to Rx stating she felt fine and does feel tender after Rx.    ACTIVITY LIMITATIONS: standing and locomotion level  PARTICIPATION LIMITATIONS: shopping and community activity  PERSONAL FACTORS: Time since onset of injury/illness/exacerbation are also affecting patient's functional outcome.   REHAB POTENTIAL: Good  CLINICAL DECISION MAKING: Stable/uncomplicated  EVALUATION COMPLEXITY: Low   GOALS:   SHORT TERM GOALS: Target date: 08/21/2023  Pt will be independent and compliant with HEP for improved pain, tolerance to activity, soft tissue mobility, strength, and function.  Baseline: Goal status: GOAL MET    2.  Pt will report at least a 25% improvement in pain and sx's overall  Baseline:  Goal status:  GOAL MET  4/21  3.  Pt will report improved ambulation distance with reduced pain.  Baseline:  Goal status:  NOT MET      LONG TERM GOALS: Target date: 10/14/2023  Pt will demo improved tightness in plantar fascia for reduced pain and improved mobility.   Baseline:  Goal status: ONGOING  2.  Pt will report she is able to ambulate extended community distance without significant pain.  Baseline:  Goal status:  NOT MET  3.  Pt will report she is ambulating without having to alter  her gait due to pain.  Baseline:  Goal status: NOT MET  4.  Pt will report at least a 70% improvement in pain and sx's overall.  Baseline:  Goal status: 95% MET   4/21  5.  Pt's worst pain will be no > than 2/10 for improved pain in AM.  Baseline:  Goal status: NOT MET    PLAN:  PT FREQUENCY: 2x/week  PT DURATION:   4 weeks  PLANNED INTERVENTIONS: 97164- PT Re-evaluation, 97110-Therapeutic exercises, 97530- Therapeutic activity, [id number]- Neuromuscular re-education, 97535- Self Care, 16109- Manual therapy, [id number]- Gait training, [phone]- Aquatic Therapy, [phone]- Electrical stimulation (unattended), [phone]- Electrical stimulation (manual), [id number]- Ultrasound, 29562- Ionotophoresis 4mg /ml Dexamethasone, Patient/Family education, Stair training, Taping, Dry Needling, Joint mobilization, Cryotherapy, and Moist heat  PLAN FOR NEXT SESSION:  Cont with soft tissue work to plantar fascia and calf.  Cont with foot intrinsic strengthening, plantar fascia and calf flexibility, and proprioceptive activities.   Trina Fujita III PT, DPT 09/27/23 2:24 PM

## 2023-09-27 ENCOUNTER — Encounter (HOSPITAL_BASED_OUTPATIENT_CLINIC_OR_DEPARTMENT_OTHER): Payer: Self-pay | Admitting: Physical Therapy

## 2023-09-30 ENCOUNTER — Ambulatory Visit (HOSPITAL_BASED_OUTPATIENT_CLINIC_OR_DEPARTMENT_OTHER): Payer: Self-pay | Admitting: Physical Therapy

## 2023-09-30 ENCOUNTER — Encounter (HOSPITAL_BASED_OUTPATIENT_CLINIC_OR_DEPARTMENT_OTHER): Payer: Self-pay | Admitting: Physical Therapy

## 2023-09-30 DIAGNOSIS — M79671 Pain in right foot: Secondary | ICD-10-CM

## 2023-09-30 DIAGNOSIS — R262 Difficulty in walking, not elsewhere classified: Secondary | ICD-10-CM

## 2023-09-30 NOTE — Therapy (Signed)
 OUTPATIENT PHYSICAL THERAPY LOWER EXTREMITY TREATMENT   Patient Name: Diane Morales MRN: [id number] DOB:1970/06/03, 53 y.o., female Today's Date: 09/30/2023  END OF SESSION:  PT End of Session - 09/30/23 0807     Visit Number 12    Number of Visits 16    Date for PT Re-Evaluation 10/14/23    Authorization Type BCBS    PT Start Time 0804    PT Stop Time 0846    PT Time Calculation (min) 42 min    Activity Tolerance Patient tolerated treatment well    Behavior During Therapy [facility] for tasks assessed/performed                   Past Medical History:  Diagnosis Date   HTN (hypertension)    Past Surgical History:  Procedure Laterality Date   CESAREAN SECTION  2006   CHOLECYSTECTOMY  05/16/2017   Patient Active Problem List   Diagnosis Date Noted   Other long term (current) drug therapy 04/16/2022   Plantar fasciitis of right foot 03/05/2022   Rotator cuff tendonitis, right 03/05/2022   Umbilical hernia 03/05/2022   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   Laryngopharyngeal reflux (LPR) 03/03/2019   Neck pain 03/03/2019   Referred otalgia of right ear 03/03/2019   Shortness of breath 03/03/2019   Essential hypertension 12/09/2018   GERD (gastroesophageal reflux disease) 12/09/2018   Hyperlipidemia 12/09/2018   Chronic constipation 01/28/2018   Overweight (BMI 25.0-29.9) 01/28/2018   S/P laparoscopic cholecystectomy 05/16/2017   Celiac disease 08/08/2011   Depression 08/08/2011   Hypothyroid 06/27/2011    PCP: Maryln Sober, PA-C  REFERRING PROVIDER: Charity Conch, DPM   REFERRING DIAG: M72.2 (ICD-10-CM) - Plantar fasciitis   THERAPY DIAG:  Pain in right foot  Difficulty in walking, not elsewhere classified  Rationale for Evaluation and Treatment: Rehabilitation  ONSET DATE: 3 year hx of pain / 07/24/23 PT order  SUBJECTIVE:   SUBJECTIVE STATEMENT: Pt states she felt ok after prior Rx.  Pt has had her daughter work  on her plantar fascia this week and it feels better.   She states she has pain with the soft tissue work, but feels much better afterwards.        PERTINENT HISTORY: Chronic plantar foot pain HTN Celiac disease  PAIN:  Are you having pain? Yes NPRS:  0/10 at rest, 2-3/10 with ambulation currently, 6-7/10 worst, 1-2/10 best Location:  medial R plantar  Type:  annoying, stapping Easing factors:  orthotics, heating pad at night, stretching Aggravating Factors:  prolonged standing, worst pain 1st thing in AM, ambulation  PRECAUTIONS: None    WEIGHT BEARING RESTRICTIONS: No  FALLS:  Has patient fallen in last 6 months? No  LIVING ENVIRONMENT: Lives with: lives with their family Lives in: 2 story home Stairs: yes   OCCUPATION: Pharmacist with [employer]  PLOF: Independent  PATIENT GOALS:  to be able to perform activities, decrease pain   OBJECTIVE:  Note: Objective measures were completed at Evaluation unless otherwise noted.  DIAGNOSTIC FINDINGS:  R ankle MRI on 07/12/23: IMPRESSION: 1. Plantar fasciitis, with thickening of the medial band of the plantar fascia, surrounding edema, and edema in the adjacent plantar calcaneal spur. 2. Common peroneus tendon sheath tenosynovitis. 3. Distal tibialis posterior tenosynovitis and tendinopathy. 4. Trace edema in the upper portion of Kager's fat pad but the adjacent Achilles tendon appears normal.  X rays of R foot per MD note: No evidence of acute fracture. Posterior  calcaneal spurring is noted.                                                                                                                                TREATMENT DATE:   5/5 STM to plantar fascia in longsitting Manual plantar fascia stretching 3x30 sec PT used roller and STM with TPR to gastroc in prone  Seated BAPS x20 each cw, ccw, and f/b Gastroc stretch on slantboard 3x30 sec bilat  5/1 Reviewed pt presentation, response to prior Rx, and pain  levels.  STM to plantar fascia in longsitting Manual plantar fascia stretching PT used roller and STM with TPR to gastroc in prone  Seated BAPS 2x10 each cw, ccw, and f/b Standing gastroc stretch 2x30 sec bilat   4/28 STM to plantar fascia Manual plantar fascia stretching Prone DF stretching IASTM using roller to gastroc in prone  Seated ankle circles x 15 each Slant board right only 30 seconds x 3  4/24  BAPS board 2x10 f/b, cw, and ccw Towel scrunches Toe yoga 2x10  Step up and hold on airex 2x10 fwd and 1x10 lateral Standing gastroc stretch on wall 2x30 sec biat Standing soleus stretch on wall 2x30 sec bilat  Manual Therapy:  Pt received rolling to R calf with the roller in prone and IASTM to R plantar fasica in longsitting to improve soft tissue tightness and pain and reduce myofascial adhesions and restrictions.     4/21 Gait:  Pt ambulates with a normalized heel to toe gait without limping.    R ankle AROM: DF:  18 Eve:  20  Pt has soft tissue tightness in plantar fascia especially in medial plantar fascia.   Manual Therapy: STM/IASTM to R plantar fascia to improve soft tissue tightness and mobility and reduce pain and myofascials restrictions.    BAPS board 2x10 f/b, cw, and ccw Marching on airex 2x10 Lateral step ups on airex with hold 2x10 Sidelying clams with BTB 2x10   PATIENT SURVEYS:  LEFS:  Initial/current: 57/80 / 63/80   PATIENT EDUCATION:  Education details:  HEP, POC, dx, rationale of interventions, objective findings, goal progress, exercise form, and relevant anatomy.  PT answered pt's questions.  Person educated: Patient Education method: Explanation, Demonstration, Verbal cues Education comprehension: verbalized understanding, returned demonstration, and verbal cues required  HOME EXERCISE PROGRAM: Access Code: [email or web address] URL: [email or web address] Date: 07/31/2023 Prepared by: Marnie Siren  Exercises - Seated Plantar  Fascia Stretch  - 2 x daily - 7 x weekly - 2-3 reps - 20-30 seconds hold - Seated Plantar Fascia Mobilization with Small Ball  - 7 x weekly - Gastroc Stretch on Wall  - 2 x daily - 7 x weekly - 2-3 reps - 20-30 seconds hold - Seated Toe Towel Scrunches  - 1 x daily - 7 x weekly - 1-2 sets - 5 reps  ASSESSMENT:  CLINICAL IMPRESSION: Pt has soft tissue tightness in plantar fascia which is  more pronounced in medial and central.  She has tenderness in plantar fascia with soft tissue work.  Pt continues to have soft tissue tightness in R calf though has improved.  PT focused on manual interventions to decrease restrictions of plantar fascia and calf and therapeutic exercise to improve ankle mobility and flexibility.  Pt responded well to Rx having no c/o's after Rx.     ACTIVITY LIMITATIONS: standing and locomotion level  PARTICIPATION LIMITATIONS: shopping and community activity  PERSONAL FACTORS: Time since onset of injury/illness/exacerbation are also affecting patient's functional outcome.   REHAB POTENTIAL: Good  CLINICAL DECISION MAKING: Stable/uncomplicated  EVALUATION COMPLEXITY: Low   GOALS:   SHORT TERM GOALS: Target date: 08/21/2023  Pt will be independent and compliant with HEP for improved pain, tolerance to activity, soft tissue mobility, strength, and function.  Baseline: Goal status: GOAL MET    2.  Pt will report at least a 25% improvement in pain and sx's overall  Baseline:  Goal status:  GOAL MET  4/21  3.  Pt will report improved ambulation distance with reduced pain.  Baseline:  Goal status:  NOT MET      LONG TERM GOALS: Target date: 10/14/2023  Pt will demo improved tightness in plantar fascia for reduced pain and improved mobility.   Baseline:  Goal status: ONGOING  2.  Pt will report she is able to ambulate extended community distance without significant pain.  Baseline:  Goal status:  NOT MET  3.  Pt will report she is ambulating without having  to alter her gait due to pain.  Baseline:  Goal status: NOT MET  4.  Pt will report at least a 70% improvement in pain and sx's overall.  Baseline:  Goal status: 95% MET   4/21  5.  Pt's worst pain will be no > than 2/10 for improved pain in AM.  Baseline:  Goal status: NOT MET    PLAN:  PT FREQUENCY: 2x/week  PT DURATION:   4 weeks  PLANNED INTERVENTIONS: 97164- PT Re-evaluation, 97110-Therapeutic exercises, 97530- Therapeutic activity, 97112- Neuromuscular re-education, 97535- Self Care, 16109- Manual therapy, [id number]- Gait training, [phone]- Aquatic Therapy, [phone]- Electrical stimulation (unattended), [phone]- Electrical stimulation (manual), [id number]- Ultrasound, 29562- Ionotophoresis 4mg /ml Dexamethasone, Patient/Family education, Stair training, Taping, Dry Needling, Joint mobilization, Cryotherapy, and Moist heat  PLAN FOR NEXT SESSION:  Cont with soft tissue work to plantar fascia and calf.  Cont with foot intrinsic strengthening, plantar fascia and calf flexibility, and proprioceptive activities.   Trina Fujita III PT, DPT 09/30/23 2:20 PM

## 2023-10-11 ENCOUNTER — Encounter (HOSPITAL_BASED_OUTPATIENT_CLINIC_OR_DEPARTMENT_OTHER): Payer: Self-pay | Admitting: Student

## 2023-10-11 ENCOUNTER — Ambulatory Visit (HOSPITAL_BASED_OUTPATIENT_CLINIC_OR_DEPARTMENT_OTHER): Admitting: Student

## 2023-10-11 ENCOUNTER — Ambulatory Visit (HOSPITAL_BASED_OUTPATIENT_CLINIC_OR_DEPARTMENT_OTHER)

## 2023-10-11 ENCOUNTER — Ambulatory Visit (HOSPITAL_BASED_OUTPATIENT_CLINIC_OR_DEPARTMENT_OTHER): Payer: Self-pay

## 2023-10-11 ENCOUNTER — Encounter (HOSPITAL_BASED_OUTPATIENT_CLINIC_OR_DEPARTMENT_OTHER): Payer: Self-pay

## 2023-10-11 DIAGNOSIS — M79672 Pain in left foot: Secondary | ICD-10-CM

## 2023-10-11 DIAGNOSIS — M79671 Pain in right foot: Secondary | ICD-10-CM | POA: Diagnosis not present

## 2023-10-11 DIAGNOSIS — R262 Difficulty in walking, not elsewhere classified: Secondary | ICD-10-CM

## 2023-10-11 NOTE — Progress Notes (Signed)
 Chief Complaint: Left foot pain     History of Present Illness:    Diane Morales is a 53 y.o. female presenting to clinic today for evaluation of left foot pain.  She does have a history of bilateral plantar fasciitis, for which she is currently undergoing treatment with physical therapy on the right side.  Pain in the left foot began about 3 or 4 days ago after some long periods of walking, however she denies any notable injury.  Pain is located mainly on the plantar aspect of the lateral midfoot.  Pain is notably worse when weightbearing.  She has tried rest, but denies any other treatments including any medications at this time.   Surgical History:   None  PMH/PSH/Family History/Social History/Meds/Allergies:    Past Medical History:  Diagnosis Date   HTN (hypertension)    Past Surgical History:  Procedure Laterality Date   CESAREAN SECTION  2006   CHOLECYSTECTOMY  05/16/2017   Social History   Socioeconomic History   Marital status: Married    Spouse name: Not on file   Number of children: Not on file   Years of education: Not on file   Highest education level: Not on file  Occupational History   Not on file  Tobacco Use   Smoking status: Never   Smokeless tobacco: Never  Substance and Sexual Activity   Alcohol use: Not on file   Drug use: Not on file   Sexual activity: Not on file  Other Topics Concern   Not on file  Social History Narrative   Not on file   Social Drivers of Health   Financial Resource Strain: Low Risk  (02/07/2023)   Received [facility]   Overall Financial Resource Strain (CARDIA)    Difficulty of Paying Living Expenses: Not hard at all  Food Insecurity: No Food Insecurity (02/07/2023)   Received from [facility]   Hunger Vital Sign    Worried About Running Out of Food in the Last Year: Never true    Ran Out of Food in the Last Year: Never true  Transportation Needs: No Transportation Needs  (02/07/2023)   Received from [facility] - Transportation    Lack of Transportation (Medical): No    Lack of Transportation (Non-Medical): No  Physical Activity: Inactive (12/28/2020)   Received from [facility]   Exercise Vital Sign    Days of Exercise per Week: 0 days    Minutes of Exercise per Session: 0 min  Stress: No Stress Concern Present (12/28/2020)   Received from [facility], [facility] Occupational [facility] - Occupational Stress Questionnaire    Feeling of Stress : Not at all  Social Connections: Unknown (10/08/2021)   Received from [facility], [facility]   Social Network    Social Network: Not on file   Family History  Problem Relation Age of Onset   Breast cancer Mother 84   Breast cancer Maternal Grandmother 72   Heart disease Father    Allergies  Allergen Reactions   Gluten Meal     Other reaction(s): Other (See Comments) Celiac Celiac    Sulfacetamide Sodium-Sulfur     Other Reaction(s): Unknown   Current Outpatient Medications  Medication Sig Dispense Refill   buPROPion (WELLBUTRIN  XL) 150 MG 24 hr tablet Take 150 mg by mouth every morning. (Patient not taking: Reported on 08/01/2023)     Calcium  Carbonate-Vit D-Min (CALCIUM  1200 PO) Take by mouth daily.     Cholecalciferol (D3 PO) Take by mouth daily.     hydrochlorothiazide  (HYDRODIURIL ) 25 MG tablet Take 25 mg by mouth daily. (Patient not taking: Reported on 08/01/2023)     hydrochlorothiazide  (HYDRODIURIL ) 25 MG tablet Take 1 tablet (25 mg total) by mouth every morning. 90 tablet 1   ketorolac (TORADOL) 10 MG tablet Take 10 mg by mouth every 6 (six) hours as needed.     levothyroxine  (SYNTHROID ) 137 MCG tablet Take 1 tablet (137 mcg total) by mouth every morning at 6 am 90 tablet 1   levothyroxine  (SYNTHROID ) 137 MCG tablet Take 1 tablet (137 mcg total) by mouth in the morning at 6am. 90 tablet 1   levothyroxine  (SYNTHROID ) 150 MCG tablet Take 125  mcg by mouth daily before breakfast. (Patient not taking: Reported on 08/01/2023)     MAGNESIUM PO Take by mouth daily.     medroxyPROGESTERone  (PROVERA ) 10 MG tablet Take 1 tablet (10 mg total) by mouth daily for 10 days as needed no cycle every 35 days (Patient not taking: Reported on 08/01/2023) 10 tablet 6   meloxicam  (MOBIC ) 15 MG tablet Take 1 tablet (15 mg total) by mouth daily. (Patient not taking: Reported on 08/01/2023) 30 tablet 1   methylPREDNISolone  (MEDROL  DOSEPAK) 4 MG TBPK tablet 6 day dose pack - take as directed (Patient not taking: Reported on 08/01/2023) 21 tablet 0   metoprolol  succinate (TOPROL -XL) 25 MG 24 hr tablet Take ONE-HALF tablets (12.5 mg total) by mouth daily. 15 tablet 0   Multiple Vitamins-Minerals (ZINC PO) Take 1 tablet by mouth daily.     olmesartan  (BENICAR ) 20 MG tablet Take 20 mg by mouth daily.     olmesartan  (BENICAR ) 20 MG tablet Take 1 tablet (20 mg total) by mouth daily. (Patient not taking: Reported on 08/01/2023) 90 tablet 1   pantoprazole  (PROTONIX ) 40 MG tablet Take 1 tablet by mouth daily.     pantoprazole  (PROTONIX ) 40 MG tablet Take 1 tablet (40 mg total) by mouth daily. (Patient not taking: Reported on 08/01/2023) 90 tablet 1   prednisoLONE  acetate (PRED FORTE ) 1 % ophthalmic suspension Instill 1 drop into affected eye 4 (four) times daily. 5 mL 0   rosuvastatin  (CRESTOR ) 10 MG tablet Take 1 tablet (10 mg total) by mouth daily. (Patient not taking: Reported on 08/01/2023) 15 tablet 0   traMADol  (ULTRAM ) 50 MG tablet Take 1 tablet (50 mg total) by mouth every 8 (eight) hours as needed for breakthrough pain. 21 tablet 0   Turmeric 500 MG CAPS Take by mouth 2 (two) times daily. (Patient not taking: Reported on 08/01/2023)     No current facility-administered medications for this visit.   No results found.  Review of Systems:   A ROS was performed including pertinent positives and negatives as documented in the HPI.  Physical Exam :   Constitutional: NAD and  appears stated age Neurological: Alert and oriented Psych: Appropriate affect and cooperative There were no vitals taken for this visit.   Comprehensive Musculoskeletal Exam:    Exam of the left foot demonstrates no visible abnormality.  Tenderness with palpation most pronounced over the plantar aspect of the fifth metatarsal base.  No appreciable tenderness along the peroneal tendon distribution or calcaneus.  Full active range of motion of the  ankle with dorsiflexion and plantarflexion.  DP pulse 2+.  Imaging:   Xray (left foot 3 views): Small plantar calcaneal spur but otherwise negative for bony abnormality   I personally reviewed and interpreted the radiographs.   Assessment:   53 y.o. female presenting with approximately 4-day history of atraumatic left foot pain.  Pain is worse with weightbearing and activity, and is most replicated at the plantar aspect of the fifth metatarsal base.  X-rays today are negative for any type of fracture or stress reaction in this area.  Discussed that given her symptoms, I would be suspicious of a potential fifth metatarsal stress fracture versus peroneal tendinitis.  Will recommend continuing use of supportive footwear with addition of NSAIDs.  She does have some meloxicam  at home but I would be happy to refill this if needed.  Should symptoms continue to persist or worsen, may consider immobilization with a postop shoe.  Plan :    - Trial course of NSAIDs and return to clinic as needed should symptoms persist or worsen     I personally saw and evaluated the patient, and participated in the management and treatment plan.  Sharrell Deck, PA-C Orthopedics

## 2023-10-11 NOTE — Therapy (Signed)
 OUTPATIENT PHYSICAL THERAPY LOWER EXTREMITY TREATMENT   Patient Name: Diane Morales MRN: [id number] DOB:08/13/1970, 53 y.o., female Today's Date: 10/11/2023  END OF SESSION:  PT End of Session - 10/11/23 1350     Visit Number 13    Number of Visits 16    Date for PT Re-Evaluation 10/14/23    Authorization Type BCBS    PT Start Time 1302    PT Stop Time 1343    PT Time Calculation (min) 41 min    Activity Tolerance Patient tolerated treatment well    Behavior During Therapy WFL for tasks assessed/performed                    Past Medical History:  Diagnosis Date   HTN (hypertension)    Past Surgical History:  Procedure Laterality Date   CESAREAN SECTION  2006   CHOLECYSTECTOMY  05/16/2017   Patient Active Problem List   Diagnosis Date Noted   Other long term (current) drug therapy 04/16/2022   Plantar fasciitis of right foot 03/05/2022   Rotator cuff tendonitis, right 03/05/2022   Umbilical hernia 03/05/2022   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   Laryngopharyngeal reflux (LPR) 03/03/2019   Neck pain 03/03/2019   Referred otalgia of right ear 03/03/2019   Shortness of breath 03/03/2019   Essential hypertension 12/09/2018   GERD (gastroesophageal reflux disease) 12/09/2018   Hyperlipidemia 12/09/2018   Chronic constipation 01/28/2018   Overweight (BMI 25.0-29.9) 01/28/2018   S/P laparoscopic cholecystectomy 05/16/2017   Celiac disease 08/08/2011   Depression 08/08/2011   Hypothyroid 06/27/2011    PCP: Maryln Sober, PA-C  REFERRING PROVIDER: Charity Conch, DPM   REFERRING DIAG: M72.2 (ICD-10-CM) - Plantar fasciitis   THERAPY DIAG:  Pain in right foot  Difficulty in walking, not elsewhere classified  Rationale for Evaluation and Treatment: Rehabilitation  ONSET DATE: 3 year hx of pain / 07/24/23 PT order  SUBJECTIVE:   SUBJECTIVE STATEMENT: Pt arrives with some pain in L foot which started after walking  yesterday. She plans to see ortho MD today about this. States her R foot has been doing better. Having her daughter help perform STM.     PERTINENT HISTORY: Chronic plantar foot pain HTN Celiac disease  PAIN:  Are you having pain? Yes NPRS:  0/10 at rest, 2-3/10 with ambulation currently, 6-7/10 worst, 1-2/10 best Location:  medial R plantar  Type:  annoying, stapping Easing factors:  orthotics, heating pad at night, stretching Aggravating Factors:  prolonged standing, worst pain 1st thing in AM, ambulation  PRECAUTIONS: None    WEIGHT BEARING RESTRICTIONS: No  FALLS:  Has patient fallen in last 6 months? 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  DIAGNOSTIC FINDINGS:  R ankle MRI on 07/12/23: IMPRESSION: 1. Plantar fasciitis, with thickening of the medial band of the plantar fascia, surrounding edema, and edema in the adjacent plantar calcaneal spur. 2. Common peroneus tendon sheath tenosynovitis. 3. Distal tibialis posterior tenosynovitis and tendinopathy. 4. Trace edema in the upper portion of Kager's fat pad but the adjacent Achilles tendon appears normal.  X rays of R foot per MD note: No evidence of acute fracture. Posterior calcaneal spurring is noted.  TREATMENT DATE:    5/16: STM to plantar fascia in longsitting Manual plantar fascia stretching 3x30 sec roller and STM with TPR to gastroc in prone  Gastroc stretch on slantboard 3x30 sec R only Seated BAPS x20 each cw, ccw, and f/b Short foot seated 2x10 Toe yoga x20ea  5/5 STM to plantar fascia in  longsitting Manual plantar fascia stretching 3x30 sec PT used roller and STM with TPR to gastroc in prone  Seated BAPS x20 each cw, ccw, and f/b Gastroc stretch on slantboard 3x30 sec bilat  5/1 Reviewed pt presentation, response to prior Rx, and pain levels.  STM to plantar fascia in longsitting Manual plantar fascia stretching PT used roller and STM with TPR to gastroc in prone  Seated BAPS 2x10 each cw, ccw, and f/b Standing gastroc stretch 2x30 sec bilat   4/28 STM to plantar fascia Manual plantar fascia stretching Prone DF stretching IASTM using roller to gastroc in prone  Seated ankle circles x 15 each Slant board right only 30 seconds x 3  4/24  BAPS board 2x10 f/b, cw, and ccw Towel scrunches Toe yoga 2x10  Step up and hold on airex 2x10 fwd and 1x10 lateral Standing gastroc stretch on wall 2x30 sec biat Standing soleus stretch on wall 2x30 sec bilat  Manual Therapy:  Pt received rolling to R calf with the roller in prone and IASTM to R plantar fasica in longsitting to improve soft tissue tightness and pain and reduce myofascial adhesions and restrictions.     4/21 Gait:  Pt ambulates with a normalized heel to toe gait without limping.    R ankle AROM: DF:  18 Eve:  20  Pt has soft tissue tightness in plantar fascia especially in medial plantar fascia.   Manual Therapy: STM/IASTM to R plantar fascia to improve soft tissue tightness and mobility and reduce pain and myofascials restrictions.    BAPS board 2x10 f/b, cw, and ccw Marching on airex 2x10 Lateral step ups on airex with hold 2x10 Sidelying clams with BTB 2x10   PATIENT SURVEYS:  LEFS:  Initial/current: 57/80 / 63/80   PATIENT EDUCATION:  Education details:  HEP, POC, dx, rationale of interventions, objective findings, goal progress, exercise form, and relevant anatomy.  PT answered pt's questions.  Person educated: Patient Education method: Explanation, Demonstration, Verbal  cues Education comprehension: verbalized understanding, returned demonstration, and verbal cues required  HOME EXERCISE PROGRAM: Access Code: [email or web address] URL: [email or web address] Date: 07/31/2023 Prepared by: Marnie Siren  Exercises - Seated Plantar Fascia Stretch  - 2 x daily - 7 x weekly - 2-3 reps - 20-30 seconds hold - Seated Plantar Fascia Mobilization with Small Ball  - 7 x weekly - Gastroc Stretch on Wall  - 2 x daily - 7 x weekly - 2-3 reps - 20-30 seconds hold - Seated Toe Towel Scrunches  - 1 x daily - 7 x weekly - 1-2 sets - 5 reps  ASSESSMENT:  CLINICAL IMPRESSION: Continued to work on soft tissue restrictions in R plantar fascia and R gastroc. Improved tolerance for this, though some tenderness and tightness remains present. Performed slant board on RLE only due to L foot pain. Will await MD appt for this. No complaints with seated therex today. Pt demonstrates good NMC with short foot and BAPS.    ACTIVITY LIMITATIONS: standing and locomotion level  PARTICIPATION LIMITATIONS: shopping and community activity  PERSONAL FACTORS: Time since onset of injury/illness/exacerbation are also affecting patient's functional outcome.   REHAB POTENTIAL: Good  CLINICAL DECISION MAKING: Stable/uncomplicated  EVALUATION COMPLEXITY: Low   GOALS:   SHORT TERM GOALS: Target date: 08/21/2023  Pt will be independent and compliant with HEP for improved pain, tolerance to activity, soft tissue mobility, strength, and function.  Baseline: Goal status: GOAL MET    2.  Pt will report at least a 25% improvement in pain and sx's overall  Baseline:  Goal status:  GOAL MET  4/21  3.  Pt will report improved ambulation distance with reduced pain.  Baseline:  Goal status:  NOT MET      LONG TERM GOALS: Target date: 10/14/2023  Pt will demo improved tightness in plantar fascia for reduced pain and improved mobility.   Baseline:  Goal status: ONGOING  2.  Pt will  report she is able to ambulate extended community distance without significant pain.  Baseline:  Goal status:  NOT MET  3.  Pt will report she is ambulating without having to alter her gait due to pain.  Baseline:  Goal status: NOT MET  4.  Pt will report at least a 70% improvement in pain and sx's overall.  Baseline:  Goal status: 95% MET   4/21  5.  Pt's worst pain will be no > than 2/10 for improved pain in AM.  Baseline:  Goal status: NOT MET    PLAN:  PT FREQUENCY: 2x/week  PT DURATION:   4 weeks  PLANNED INTERVENTIONS: 97164- PT Re-evaluation, 97110-Therapeutic exercises, 97530- Therapeutic activity, [id number]- Neuromuscular re-education, 97535- Self Care, 16109- Manual therapy, [id number]- Gait training, [phone]- Aquatic Therapy, [phone]- Electrical stimulation (unattended), [phone]- Electrical stimulation (manual), [id number]- Ultrasound, 29562- Ionotophoresis 4mg /ml Dexamethasone, Patient/Family education, Stair training, Taping, Dry Needling, Joint mobilization, Cryotherapy, and Moist heat  PLAN FOR NEXT SESSION:  Cont with soft tissue work to plantar fascia and calf.  Cont with foot intrinsic strengthening, plantar fascia and calf flexibility, and proprioceptive activities.   Herb Loges, PTA  10/11/23 1:53 PM

## 2023-10-19 ENCOUNTER — Ambulatory Visit (HOSPITAL_BASED_OUTPATIENT_CLINIC_OR_DEPARTMENT_OTHER): Payer: Self-pay | Admitting: Rehabilitative and Restorative Service Providers"

## 2023-10-19 ENCOUNTER — Encounter (HOSPITAL_BASED_OUTPATIENT_CLINIC_OR_DEPARTMENT_OTHER): Payer: Self-pay | Admitting: Rehabilitative and Restorative Service Providers"

## 2023-10-19 DIAGNOSIS — R262 Difficulty in walking, not elsewhere classified: Secondary | ICD-10-CM

## 2023-10-19 DIAGNOSIS — M79671 Pain in right foot: Secondary | ICD-10-CM

## 2023-10-19 NOTE — Addendum Note (Signed)
 Addended by: Vashti Gentles on: 10/19/2023 11:00 AM   Modules accepted: Orders

## 2023-10-19 NOTE — Therapy (Signed)
 OUTPATIENT PHYSICAL THERAPY LOWER EXTREMITY TREATMENT/RECERT   Patient Name: Diane Morales MRN: [id number] DOB:11-01-1970, 53 y.o., female Today's Date: 10/19/2023  END OF SESSION:  PT End of Session - 10/19/23 0928     Visit Number 14    Number of Visits 16    Date for PT Re-Evaluation 11/23/23    Authorization Type BCBS    PT Start Time 0922    PT Stop Time 1007    PT Time Calculation (min) 45 min    Activity Tolerance Patient tolerated treatment well;No increased pain    Behavior During Therapy [facility] for tasks assessed/performed                     Past Medical History:  Diagnosis Date   HTN (hypertension)    Past Surgical History:  Procedure Laterality Date   CESAREAN SECTION  2006   CHOLECYSTECTOMY  05/16/2017   Patient Active Problem List   Diagnosis Date Noted   Other long term (current) drug therapy 04/16/2022   Plantar fasciitis of right foot 03/05/2022   Rotator cuff tendonitis, right 03/05/2022   Umbilical hernia 03/05/2022   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   Laryngopharyngeal reflux (LPR) 03/03/2019   Neck pain 03/03/2019   Referred otalgia of right ear 03/03/2019   Shortness of breath 03/03/2019   Essential hypertension 12/09/2018   GERD (gastroesophageal reflux disease) 12/09/2018   Hyperlipidemia 12/09/2018   Chronic constipation 01/28/2018   Overweight (BMI 25.0-29.9) 01/28/2018   S/P laparoscopic cholecystectomy 05/16/2017   Celiac disease 08/08/2011   Depression 08/08/2011   Hypothyroid 06/27/2011    PCP: Maryln Sober, PA-C  REFERRING PROVIDER: Charity Conch, DPM   REFERRING DIAG: M72.2 (ICD-10-CM) - Plantar fasciitis   THERAPY DIAG:  Pain in right foot  Difficulty in walking, not elsewhere classified  Rationale for Evaluation and Treatment: Rehabilitation  ONSET DATE: 3 year hx of pain / 07/24/23 PT order  SUBJECTIVE:   SUBJECTIVE STATEMENT: Pt reports R foot pain to be much  improved.      PERTINENT HISTORY: Chronic plantar foot pain HTN Celiac disease  PAIN:  Are you having pain? Yes NPRS:  0/10 at rest, 2-3/10 with ambulation currently, 6-7/10 worst, 1-2/10 best Location:  medial R plantar  Type:  annoying, stapping Easing factors:  orthotics, heating pad at night, stretching Aggravating Factors:  prolonged standing, worst pain 1st thing in AM, ambulation  PRECAUTIONS: None    WEIGHT BEARING RESTRICTIONS: No  FALLS:  Has patient fallen in last 6 months? 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  DIAGNOSTIC FINDINGS:  R ankle MRI on 07/12/23: IMPRESSION: 1. Plantar fasciitis, with thickening of the medial band of the plantar fascia, surrounding edema, and edema in the adjacent plantar calcaneal spur. 2. Common peroneus tendon sheath tenosynovitis. 3. Distal tibialis posterior tenosynovitis and tendinopathy. 4. Trace edema in the upper portion of Kager's fat pad but the adjacent Achilles tendon appears normal.  X rays of R foot per MD note: No evidence of acute fracture. Posterior calcaneal spurring is noted.  TREATMENT DATE:    10/19/23: Therex -R single limb stance: L hip abduction standing x 20; L hip abduction/ext combo x 20; Rcalf raise x 20; R single leg sit to stand partial from bed x 20; seated iso R LAQ with ankle PF/DF. Pt reports no pain with activities  Manual Therapy -STW/deep tissue massage R plantar fascia, R calf. Metatarsal mobs mets1-5 grade 2-3; toe stretching into flexion/extension; ankle inversion/eversion PROM with  sustained hold; Manual gastroc stretching   5/16: STM to plantar fascia in longsitting Manual plantar fascia stretching 3x30 sec roller and STM with TPR to gastroc in prone  Gastroc stretch on slantboard 3x30 sec R only Seated BAPS x20 each cw, ccw, and f/b Short foot seated 2x10 Toe yoga x20ea  5/5 STM to plantar fascia in longsitting Manual plantar fascia stretching 3x30 sec PT used roller and STM with TPR to gastroc in prone  Seated BAPS x20 each cw, ccw, and f/b Gastroc stretch on slantboard 3x30 sec bilat  5/1 Reviewed pt presentation, response to prior Rx, and pain levels.  STM to plantar fascia in longsitting Manual plantar fascia stretching PT used roller and STM with TPR to gastroc in prone  Seated BAPS 2x10 each cw, ccw, and f/b Standing gastroc stretch 2x30 sec bilat   4/28 STM to plantar fascia Manual plantar fascia stretching Prone DF stretching IASTM using roller to gastroc in prone  Seated ankle circles x 15 each Slant board right only 30 seconds x 3  4/24  BAPS board 2x10 f/b, cw, and ccw Towel scrunches Toe yoga 2x10  Step up and hold on airex 2x10 fwd and 1x10 lateral Standing gastroc stretch on wall 2x30 sec biat Standing soleus stretch on wall 2x30 sec bilat  Manual Therapy:  Pt received rolling to R calf with the roller in prone and IASTM to R plantar fasica in longsitting to improve soft tissue tightness and pain and reduce myofascial adhesions and restrictions.     4/21 Gait:  Pt ambulates with a normalized heel to toe gait without limping.    R ankle AROM: DF:  18 Eve:  20  Pt has soft tissue tightness in plantar fascia especially in medial plantar fascia.   Manual Therapy: STM/IASTM to R plantar fascia to improve soft tissue tightness and mobility and reduce pain and myofascials restrictions.    BAPS board 2x10 f/b, cw, and ccw Marching on airex 2x10 Lateral step ups on airex with hold 2x10 Sidelying clams with BTB 2x10    PATIENT SURVEYS:  LEFS:  Initial/current: 57/80 / 63/80;    PATIENT EDUCATION:  Education details:  HEP, POC, dx, rationale of interventions, objective findings, goal progress, exercise form, and relevant anatomy.  PT answered pt's questions.  Person educated: Patient Education method: Explanation, Demonstration, Verbal cues Education comprehension: verbalized understanding, returned demonstration, and verbal cues required  HOME EXERCISE PROGRAM: Access Code: [email or web address] URL: [email or web address] Date: 07/31/2023 Prepared by: Marnie Siren  Exercises - Seated Plantar Fascia Stretch  - 2 x daily - 7 x weekly - 2-3 reps - 20-30 seconds hold - Seated Plantar Fascia Mobilization with Small Ball  - 7 x weekly - Gastroc Stretch on Wall  - 2 x daily - 7 x weekly - 2-3 reps - 20-30 seconds hold - Seated Toe Towel Scrunches  - 1 x daily - 7 x weekly - 1-2 sets - 5 reps  ASSESSMENT:  CLINICAL IMPRESSION: Continued to work on soft tissue restrictions in R plantar fascia and R gastroc. Pt  able to tolerate all without increased pain and reports to feel looser after tx. Pt feels she is ready to see if plan of care is ready for discharge. Therefore, pt to not attend therapy x 2-3 weeks in prep for DC if appropriate. She did not discuss discomfort with left foot this visit.   ACTIVITY LIMITATIONS: standing and locomotion level  PARTICIPATION LIMITATIONS: shopping and community activity  PERSONAL FACTORS: Time since onset of injury/illness/exacerbation are also affecting patient's functional outcome.   REHAB POTENTIAL: Good  CLINICAL DECISION MAKING: Stable/uncomplicated  EVALUATION COMPLEXITY: Low   GOALS:   SHORT TERM GOALS: Target date: 08/21/2023  Pt will be independent and compliant with HEP for improved pain, tolerance to activity, soft tissue mobility, strength, and function.  Baseline: Goal status: GOAL MET    2.  Pt will report at least a 25% improvement in  pain and sx's overall  Baseline:  Goal status:  GOAL MET  4/21  3.  Pt will report improved ambulation distance with reduced pain.  Baseline:  Goal status:  MET      LONG TERM GOALS: Target date: 11/23/2023  Pt will demo improved tightness in plantar fascia for reduced pain and improved mobility.   Baseline:  Goal status: ONGOING  2.  Pt will report she is able to ambulate extended community distance without significant pain.  Baseline:  Goal status:  MET  3.  Pt will report she is ambulating without having to alter her gait due to pain.  Baseline:  Goal status: MET  4.  Pt will report at least a 70% improvement in pain and sx's overall.  Baseline:  Goal status: 95% MET   4/21  5.  Pt's worst pain will be no > than 2/10 for improved pain in AM.  Baseline:  Goal status: PARTIALLY MET    PLAN:  PT FREQUENCY: 2x/week  PT DURATION:  5 weeks  PLANNED INTERVENTIONS: 97164- PT Re-evaluation, 97110-Therapeutic exercises, 97530- Therapeutic activity, [id number]- Neuromuscular re-education, 97535- Self Care, 16109- Manual therapy, [id number]- Gait training, [phone]- Aquatic Therapy, [phone]- Electrical stimulation (unattended), [phone]- Electrical stimulation (manual), [id number]- Ultrasound, 29562- Ionotophoresis 4mg /ml Dexamethasone, Patient/Family education, Stair training, Taping, Dry Needling, Joint mobilization, Cryotherapy, and Moist heat  PLAN FOR NEXT SESSION:  How did pt do with time off from therapy; ready for DC or continue? If continue, cont with soft tissue work to plantar fascia and calf.  Cont with foot intrinsic strengthening, plantar fascia and calf flexibility, and proprioceptive activities.

## 2023-10-25 ENCOUNTER — Ambulatory Visit
Admission: RE | Admit: 2023-10-25 | Discharge: 2023-10-25 | Disposition: A | Discharge status: Home / Self Care | Payer: Federal, State, Local not specified - PPO | Source: Ambulatory Visit | Attending: Obstetrics and Gynecology | Admitting: Obstetrics and Gynecology

## 2023-10-25 DIAGNOSIS — Z1231 Encounter for screening mammogram for malignant neoplasm of breast: Secondary | ICD-10-CM

## 2023-10-29 ENCOUNTER — Other Ambulatory Visit (HOSPITAL_BASED_OUTPATIENT_CLINIC_OR_DEPARTMENT_OTHER): Payer: Self-pay

## 2023-10-29 MED ORDER — MEDROXYPROGESTERONE ACETATE 10 MG PO TABS
10.0000 mg | ORAL_TABLET | Freq: Every day | ORAL | 6 refills | Status: DC
  Filled 2023-10-29: qty 10, 10d supply, fill #0

## 2023-10-31 ENCOUNTER — Other Ambulatory Visit (HOSPITAL_BASED_OUTPATIENT_CLINIC_OR_DEPARTMENT_OTHER): Payer: Self-pay

## 2023-11-01 ENCOUNTER — Ambulatory Visit (HOSPITAL_BASED_OUTPATIENT_CLINIC_OR_DEPARTMENT_OTHER): Attending: Podiatry | Admitting: Physical Therapy

## 2023-11-01 ENCOUNTER — Encounter (HOSPITAL_BASED_OUTPATIENT_CLINIC_OR_DEPARTMENT_OTHER): Payer: Self-pay | Admitting: Physical Therapy

## 2023-11-01 DIAGNOSIS — R262 Difficulty in walking, not elsewhere classified: Secondary | ICD-10-CM | POA: Insufficient documentation

## 2023-11-01 DIAGNOSIS — M79671 Pain in right foot: Secondary | ICD-10-CM | POA: Insufficient documentation

## 2023-11-01 NOTE — Therapy (Signed)
 OUTPATIENT PHYSICAL THERAPY LOWER EXTREMITY TREATMENT/RECERT   Patient Name: Diane Morales MRN: [id number] DOB:1971/04/13, 53 y.o., female Today's Date: 11/01/2023  END OF SESSION:  PT End of Session - 11/01/23 1524     Visit Number 15    Number of Visits 16    Date for PT Re-Evaluation 11/23/23    Authorization Type BCBS    PT Start Time 1522    PT Stop Time 1600    PT Time Calculation (min) 38 min    Activity Tolerance Patient tolerated treatment well;No increased pain    Behavior During Therapy [facility] for tasks assessed/performed                     Past Medical History:  Diagnosis Date   HTN (hypertension)    Past Surgical History:  Procedure Laterality Date   CESAREAN SECTION  2006   CHOLECYSTECTOMY  05/16/2017   Patient Active Problem List   Diagnosis Date Noted   Other long term (current) drug therapy 04/16/2022   Plantar fasciitis of right foot 03/05/2022   Rotator cuff tendonitis, right 03/05/2022   Umbilical hernia 03/05/2022   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   Laryngopharyngeal reflux (LPR) 03/03/2019   Neck pain 03/03/2019   Referred otalgia of right ear 03/03/2019   Shortness of breath 03/03/2019   Essential hypertension 12/09/2018   GERD (gastroesophageal reflux disease) 12/09/2018   Hyperlipidemia 12/09/2018   Chronic constipation 01/28/2018   Overweight (BMI 25.0-29.9) 01/28/2018   S/P laparoscopic cholecystectomy 05/16/2017   Celiac disease 08/08/2011   Depression 08/08/2011   Hypothyroid 06/27/2011    PCP: Maryln Sober, PA-C  REFERRING PROVIDER: Charity Conch, DPM   REFERRING DIAG: M72.2 (ICD-10-CM) - Plantar fasciitis   THERAPY DIAG:  Pain in right foot  Difficulty in walking, not elsewhere classified  Rationale for Evaluation and Treatment: Rehabilitation  ONSET DATE: 3 year hx of pain / 07/24/23 PT order  SUBJECTIVE:   SUBJECTIVE STATEMENT: Pt reports foot pain much better.  Cramping in anterior tib at night.      PERTINENT HISTORY: Chronic plantar foot pain HTN Celiac disease  PAIN:  Are you having pain? Yes NPRS:  0/10 at rest, 2-3/10 with ambulation currently, 6-7/10 worst, 1-2/10 best Location:  medial R plantar  Type:  annoying, stapping Easing factors:  orthotics, heating pad at night, stretching Aggravating Factors:  prolonged standing, worst pain 1st thing in AM, ambulation  PRECAUTIONS: None    WEIGHT BEARING RESTRICTIONS: No  FALLS:  Has patient fallen in last 6 months? 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  DIAGNOSTIC FINDINGS:  R ankle MRI on 07/12/23: IMPRESSION: 1. Plantar fasciitis, with thickening of the medial band of the plantar fascia, surrounding edema, and edema in the adjacent plantar calcaneal spur. 2. Common peroneus tendon sheath tenosynovitis. 3. Distal tibialis posterior tenosynovitis and tendinopathy. 4. Trace edema in the upper portion of Kager's fat pad but the adjacent Achilles tendon appears normal.  X rays of R foot per MD note: No evidence of acute fracture. Posterior calcaneal spurring is noted.  TREATMENT DATE:   11/01/23 Manual: STM to tib anterior bilaterally Ankle DF GTB 2 x 10 Education on compression garments    10/19/23: Therex -R single limb stance: L hip abduction standing x 20; L hip abduction/ext combo x 20; Rcalf raise x 20; R single leg sit to stand partial from bed x 20; seated iso R LAQ with ankle PF/DF. Pt reports no pain with activities  Manual Therapy -STW/deep tissue massage  R plantar fascia, R calf. Metatarsal mobs mets1-5 grade 2-3; toe stretching into flexion/extension; ankle inversion/eversion PROM with sustained hold; Manual gastroc stretching   5/16: STM to plantar fascia in longsitting Manual plantar fascia stretching 3x30 sec roller and STM with TPR to gastroc in prone  Gastroc stretch on slantboard 3x30 sec R only Seated BAPS x20 each cw, ccw, and f/b Short foot seated 2x10 Toe yoga x20ea  5/5 STM to plantar fascia in longsitting Manual plantar fascia stretching 3x30 sec PT used roller and STM with TPR to gastroc in prone  Seated BAPS x20 each cw, ccw, and f/b Gastroc stretch on slantboard 3x30 sec bilat  5/1 Reviewed pt presentation, response to prior Rx, and pain levels.  STM to plantar fascia in longsitting Manual plantar fascia stretching PT used roller and STM with TPR to gastroc in prone  Seated BAPS 2x10 each cw, ccw, and f/b Standing gastroc stretch 2x30 sec bilat   4/28 STM to plantar fascia Manual plantar fascia stretching Prone DF stretching IASTM using roller to gastroc in prone  Seated ankle circles x 15 each Slant board right only 30 seconds x 3  4/24  BAPS board 2x10 f/b, cw, and ccw Towel scrunches Toe yoga 2x10  Step up and hold on airex 2x10 fwd and 1x10 lateral Standing gastroc stretch on wall 2x30 sec biat Standing soleus stretch on wall 2x30 sec bilat  Manual Therapy:  Pt received rolling to R calf with the roller in prone and IASTM to R plantar fasica in longsitting to improve soft tissue tightness and pain and reduce myofascial adhesions and restrictions.     4/21 Gait:  Pt ambulates with a normalized heel to toe gait without limping.    R ankle AROM: DF:  18 Eve:  20  Pt has soft tissue tightness in plantar fascia especially in medial plantar fascia.   Manual Therapy: STM/IASTM to R plantar fascia to improve soft tissue tightness and mobility and reduce pain and myofascials restrictions.     BAPS board 2x10 f/b, cw, and ccw Marching on airex 2x10 Lateral step ups on airex with hold 2x10 Sidelying clams with BTB 2x10   PATIENT SURVEYS:  LEFS:  Initial/current: 57/80 / 63/80;    PATIENT EDUCATION:  Education details:  HEP, POC, dx, rationale of interventions, objective findings, goal progress, exercise form, and relevant anatomy.  PT answered pt's questions.  Person educated: Patient Education method: Explanation, Demonstration, Verbal cues Education comprehension: verbalized understanding, returned demonstration, and verbal cues required  HOME EXERCISE PROGRAM: Access Code: [email or web address] URL: [email or web address] Date: 07/31/2023 Prepared by: Marnie Siren  Exercises - Seated Plantar Fascia Stretch  - 2 x daily - 7 x weekly - 2-3 reps - 20-30 seconds hold - Seated Plantar Fascia Mobilization with Small Ball  - 7 x weekly - Gastroc Stretch on Wall  - 2 x daily - 7 x weekly - 2-3 reps - 20-30 seconds hold - Seated Toe Towel Scrunches  - 1 x daily - 7 x weekly - 1-2 sets - 5 reps  ASSESSMENT:  CLINICAL IMPRESSION: Patient with improving foot symptoms. Cramping symptoms in tib anterior bilaterally with hyperactive and tender musculature. STM and strengthening performed. Educated on possible benefit of compression garments. Patient will continue to benefit from physical therapy in order to improve function and reduce impairment.    ACTIVITY LIMITATIONS: standing and locomotion level  PARTICIPATION LIMITATIONS: shopping and community activity  PERSONAL FACTORS: Time since onset of injury/illness/exacerbation are also affecting patient's functional outcome.   REHAB POTENTIAL: Good  CLINICAL DECISION MAKING: Stable/uncomplicated  EVALUATION COMPLEXITY: Low   GOALS:   SHORT TERM GOALS: Target date: 08/21/2023  Pt will be independent and compliant with HEP for improved pain, tolerance to activity, soft tissue mobility, strength, and function.   Baseline: Goal status: GOAL MET    2.  Pt will report at least a 25% improvement in pain and sx's overall  Baseline:  Goal status:  GOAL MET  4/21  3.  Pt will report improved ambulation distance with reduced pain.  Baseline:  Goal status:  MET      LONG TERM GOALS: Target date: 11/23/2023  Pt will demo improved tightness in plantar fascia for reduced pain and improved mobility.   Baseline:  Goal status: ONGOING  2.  Pt will report she is able to ambulate extended community distance without significant pain.  Baseline:  Goal status:  MET  3.  Pt will report she is ambulating without having to alter her gait due to pain.  Baseline:  Goal status: MET  4.  Pt will report at least a 70% improvement in pain and sx's overall.  Baseline:  Goal status: 95% MET   4/21  5.  Pt's worst pain will be no > than 2/10 for improved pain in AM.  Baseline:  Goal status: PARTIALLY MET    PLAN:  PT FREQUENCY: 2x/week  PT DURATION:  5 weeks  PLANNED INTERVENTIONS: 97164- PT Re-evaluation, 97110-Therapeutic exercises, 97530- Therapeutic activity, [id number]- Neuromuscular re-education, 97535- Self Care, 29528- Manual therapy, [id number]- Gait training, [phone]- Aquatic Therapy, [phone]- Electrical stimulation (unattended), [id number]- Electrical stimulation (manual), [id number]- Ultrasound, [id number]- Ionotophoresis 4mg /ml Dexamethasone, Patient/Family education, Stair training, Taping, Dry Needling, Joint mobilization, Cryotherapy, and Moist heat  PLAN FOR NEXT SESSION:  How did pt do with time off from therapy; ready for DC or continue? If continue, cont with soft tissue work to plantar fascia and calf.  Cont with foot intrinsic strengthening, plantar fascia and calf flexibility, and proprioceptive activities.        Perfecto Bracket Alfretta Pinch, PT, DPT 11/01/2023, 4:04 PM

## 2023-11-11 ENCOUNTER — Other Ambulatory Visit (HOSPITAL_BASED_OUTPATIENT_CLINIC_OR_DEPARTMENT_OTHER): Payer: Self-pay

## 2023-11-11 MED FILL — Metoprolol Succinate Tab ER 24HR 25 MG (Tartrate Equiv): ORAL | 30 days supply | Qty: 15 | Fill #0 | Status: AC

## 2023-11-22 ENCOUNTER — Encounter (HOSPITAL_BASED_OUTPATIENT_CLINIC_OR_DEPARTMENT_OTHER): Payer: Self-pay | Admitting: Rehabilitative and Restorative Service Providers"

## 2023-11-22 ENCOUNTER — Ambulatory Visit (HOSPITAL_BASED_OUTPATIENT_CLINIC_OR_DEPARTMENT_OTHER): Admitting: Rehabilitative and Restorative Service Providers"

## 2023-11-22 DIAGNOSIS — M79671 Pain in right foot: Secondary | ICD-10-CM

## 2023-11-22 DIAGNOSIS — R262 Difficulty in walking, not elsewhere classified: Secondary | ICD-10-CM

## 2023-11-22 NOTE — Therapy (Signed)
 OUTPATIENT PHYSICAL THERAPY LOWER EXTREMITY TREATMENT/Discharge   Patient Name: Diane Morales MRN: [id number] DOB:Apr 30, 1971, 53 y.o., female Today's Date: 11/22/2023  END OF SESSION:  PT End of Session - 11/22/23 0801     Visit Number 16    Number of Visits 16    Authorization Type BCBS    PT Start Time 0801    PT Stop Time 0841    PT Time Calculation (min) 40 min    Activity Tolerance Patient tolerated treatment well;No increased pain    Behavior During Therapy [facility] for tasks assessed/performed                   Past Medical History:  Diagnosis Date   HTN (hypertension)    Past Surgical History:  Procedure Laterality Date   CESAREAN SECTION  2006   CHOLECYSTECTOMY  05/16/2017   Patient Active Problem List   Diagnosis Date Noted   Other long term (current) drug therapy 04/16/2022   Plantar fasciitis of right foot 03/05/2022   Rotator cuff tendonitis, right 03/05/2022   Umbilical hernia 03/05/2022   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   Laryngopharyngeal reflux (LPR) 03/03/2019   Neck pain 03/03/2019   Referred otalgia of right ear 03/03/2019   Shortness of breath 03/03/2019   Essential hypertension 12/09/2018   GERD (gastroesophageal reflux disease) 12/09/2018   Hyperlipidemia 12/09/2018   Chronic constipation 01/28/2018   Overweight (BMI 25.0-29.9) 01/28/2018   S/P laparoscopic cholecystectomy 05/16/2017   Celiac disease 08/08/2011   Depression 08/08/2011   Hypothyroid 06/27/2011    PCP: Donata Snowman, PA-C  REFERRING PROVIDER: Gershon Donnice SAUNDERS, DPM   REFERRING DIAG: M72.2 (ICD-10-CM) - Plantar fasciitis   THERAPY DIAG:  Pain in right foot  Difficulty in walking, not elsewhere classified  Rationale for Evaluation and Treatment: Rehabilitation  ONSET DATE: 3 year hx of pain / 07/24/23 PT order  SUBJECTIVE:   SUBJECTIVE STATEMENT: I am doing good. Sometimes I cramp and I stretch it out. Going barefoot on  tile is aggravating and I just know not to do it. Ready for discharge; I can handle it. If I need to come back, I will.      PERTINENT HISTORY: Chronic plantar foot pain HTN Celiac disease  PAIN:  Are you having pain? Yes NPRS:  0/10 at rest, 2-3/10 with ambulation currently, 6-7/10 worst, 1-2/10 best Location:  medial R plantar  Type:  annoying, stapping Easing factors:  orthotics, heating pad at night, stretching Aggravating Factors:  prolonged standing, worst pain 1st thing in AM, ambulation  PRECAUTIONS: None    WEIGHT BEARING RESTRICTIONS: No  FALLS:  Has patient fallen in last 6 months? 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  DIAGNOSTIC FINDINGS:  R ankle MRI on 07/12/23: IMPRESSION: 1. Plantar fasciitis, with thickening of the medial band of the plantar fascia, surrounding edema, and edema in the adjacent plantar calcaneal spur. 2. Common peroneus tendon sheath tenosynovitis. 3. Distal tibialis posterior tenosynovitis and tendinopathy. 4. Trace edema in the upper portion of Kager's fat pad but the adjacent Achilles tendon appears normal.  X rays of R foot per MD note: No evidence of acute fracture. Posterior calcaneal spurring is noted.  TREATMENT DATE:    [facility] Adult PT Treatment:                                                DATE: 11/22/23 Therapeutic Exercise: Seated Ant Tib stretch 2x30 sec each side Standing Gastroc stretch 2x30 sec bil Manual Therapy: STW/trigger point to R Ant Tib and R plantar fasciitis; metatarsal mobs grade 2-3 and toe flexion  stretching due to R toes all resting in extension. While doing manual, discussed HEP and to further stretch R toes into flexion while sitting watching TV.    11/01/23 Manual: STM to tib anterior bilaterally Ankle DF GTB 2 x 10 Education on compression garments    10/19/23: Therex -R single limb stance: L hip abduction standing x 20; L hip abduction/ext combo x 20; Rcalf raise x 20; R single leg sit to stand partial from bed x 20; seated iso R LAQ with ankle PF/DF. Pt reports no pain with activities  Manual Therapy -STW/deep tissue massage R plantar fascia, R calf. Metatarsal mobs mets1-5 grade 2-3; toe stretching into flexion/extension; ankle inversion/eversion PROM with sustained hold; Manual gastroc stretching   5/16: STM to plantar fascia in longsitting Manual plantar fascia stretching 3x30 sec roller and STM with TPR to gastroc in prone  Gastroc stretch on slantboard 3x30 sec R only Seated BAPS x20 each cw, ccw, and f/b Short foot seated 2x10 Toe yoga x20ea  5/5 STM to plantar fascia in longsitting Manual plantar fascia stretching 3x30 sec PT used roller and STM with TPR to gastroc in prone  Seated BAPS x20 each cw, ccw, and f/b Gastroc stretch on slantboard 3x30 sec bilat  5/1 Reviewed pt presentation, response to prior Rx, and pain levels.  STM to plantar fascia in longsitting Manual plantar fascia stretching PT used roller and STM with TPR to gastroc in prone  Seated BAPS 2x10 each cw, ccw, and f/b Standing gastroc stretch 2x30 sec bilat   4/28 STM to plantar fascia Manual plantar fascia stretching Prone DF stretching IASTM using roller to gastroc in prone  Seated ankle circles x 15 each Slant board right only 30 seconds x 3  4/24  BAPS board 2x10 f/b, cw, and ccw Towel scrunches Toe yoga 2x10  Step up and hold on airex 2x10 fwd and 1x10 lateral Standing gastroc stretch on wall 2x30 sec biat Standing soleus stretch on wall 2x30 sec bilat  Manual  Therapy:  Pt received rolling to R calf with the roller in prone and IASTM to R plantar fasica in longsitting to improve soft tissue tightness and pain and reduce myofascial adhesions and restrictions.     4/21 Gait:  Pt ambulates with a normalized heel to toe gait without limping.    R ankle AROM: DF:  18 Eve:  20  Pt has soft tissue tightness in plantar fascia especially in medial plantar fascia.   Manual Therapy: STM/IASTM to R plantar fascia to improve soft tissue tightness and mobility and reduce pain and myofascials restrictions.    BAPS board 2x10 f/b, cw, and ccw Marching on airex 2x10 Lateral step ups on airex with hold 2x10 Sidelying clams with BTB 2x10   PATIENT SURVEYS:  LEFS:  Initial/current: 57/80 / 63/80;    PATIENT EDUCATION:  Education details:  HEP, POC, dx, rationale of interventions, objective findings, goal progress, exercise form, and relevant anatomy.  PT answered pt's questions.  Person educated: Patient Education method: Explanation, Demonstration, Verbal cues Education comprehension: verbalized understanding, returned demonstration, and verbal cues required  HOME EXERCISE PROGRAM: Access Code: [email or web address] URL: [email or web address] Date: 07/31/2023 Prepared by: Mose Minerva  Exercises - Seated Plantar Fascia Stretch  - 2 x daily - 7 x weekly - 2-3 reps - 20-30 seconds hold - Seated Plantar Fascia Mobilization with Small Ball  - 7 x weekly - Gastroc Stretch on Wall  - 2 x daily - 7 x weekly - 2-3 reps - 20-30 seconds hold - Seated Toe Towel Scrunches  - 1 x daily - 7 x weekly - 1-2 sets - 5 reps  ASSESSMENT:  CLINICAL IMPRESSION: Pt has had several weeks off of PT; she is able to self manage tightness when she has it. DC to HEP. Pt reports 100% improvement   ACTIVITY LIMITATIONS: standing and locomotion level  PARTICIPATION LIMITATIONS: shopping and community activity  PERSONAL FACTORS: Time since onset of  injury/illness/exacerbation are also affecting patient's functional outcome.   REHAB POTENTIAL: Good  CLINICAL DECISION MAKING: Stable/uncomplicated  EVALUATION COMPLEXITY: Low   GOALS:   SHORT TERM GOALS: Target date: 08/21/2023  Pt will be independent and compliant with HEP for improved pain, tolerance to activity, soft tissue mobility, strength, and function.  Baseline: Goal status: GOAL MET    2.  Pt will report at least a 25% improvement in pain and sx's overall  Baseline:  Goal status:  GOAL MET  4/21  3.  Pt will report improved ambulation distance with reduced pain.  Baseline:  Goal status:  MET      LONG TERM GOALS: Target date: 11/23/2023  Pt will demo improved tightness in plantar fascia for reduced pain and improved mobility.   Baseline:  Goal status: MET  2.  Pt will report she is able to ambulate extended community distance without significant pain.  Baseline:  Goal status:  MET  3.  Pt will report she is ambulating without having to alter her gait due to pain.  Baseline:  Goal status: MET  4.  Pt will report at least a 70% improvement in pain and sx's overall.  Baseline:  Goal status: 95% MET   4/21  5.  Pt's worst pain will be no > than 2/10 for improved pain in AM.  Baseline:  Goal status: MET    PLAN:  PT FREQUENCY: 2x/week  PT DURATION:  5 weeks  PLANNED INTERVENTIONS: 97164- PT Re-evaluation, 97110-Therapeutic exercises, 97530- Therapeutic activity, 97112- Neuromuscular re-education, 97535- Self Care, 02859- Manual therapy, [id number]- Gait training, [phone]- Aquatic Therapy, [phone]- Electrical stimulation (unattended), [phone]- Electrical stimulation (manual), [id number]- Ultrasound, 02966- Ionotophoresis 4mg /ml Dexamethasone, Patient/Family education, Stair training, Taping, Dry Needling, Joint mobilization, Cryotherapy, and Moist heat  PLAN FOR NEXT SESSION:  N/A; discharge visit today  PHYSICAL THERAPY DISCHARGE SUMMARY  Visits from Start of Care:  16  Current functional level related to goals / functional outcomes: All goals met   Remaining deficits: none   Education / Equipment: HEP   Patient agrees to discharge. Patient goals were met. Patient is being discharged due to meeting the stated rehab goals.    Alger Ada, PT, DPT 11/22/2023, 8:45 AM

## 2023-11-28 ENCOUNTER — Other Ambulatory Visit (HOSPITAL_BASED_OUTPATIENT_CLINIC_OR_DEPARTMENT_OTHER): Payer: Self-pay

## 2023-11-28 MED ORDER — INDOMETHACIN 50 MG PO CAPS
ORAL_CAPSULE | ORAL | 0 refills | Status: DC
  Filled 2023-11-28: qty 60, 30d supply, fill #0

## 2023-11-28 MED ORDER — COLCHICINE 0.6 MG PO TABS
ORAL_TABLET | ORAL | 2 refills | Status: DC
  Filled 2023-11-28: qty 60, 30d supply, fill #0

## 2023-12-05 ENCOUNTER — Telehealth (HOSPITAL_BASED_OUTPATIENT_CLINIC_OR_DEPARTMENT_OTHER): Payer: Self-pay | Admitting: Family

## 2023-12-05 ENCOUNTER — Other Ambulatory Visit (HOSPITAL_BASED_OUTPATIENT_CLINIC_OR_DEPARTMENT_OTHER): Payer: Self-pay

## 2023-12-05 MED ORDER — METOPROLOL SUCCINATE ER 25 MG PO TB24
12.5000 mg | ORAL_TABLET | Freq: Every day | ORAL | 2 refills | Status: DC
  Filled 2023-12-05: qty 15, 30d supply, fill #0
  Filled 2024-02-11: qty 15, 30d supply, fill #1

## 2023-12-05 NOTE — Telephone Encounter (Signed)
 Pt's medication was sent to pt's pharmacy as requested. Confirmation received.

## 2023-12-05 NOTE — Telephone Encounter (Signed)
 Pt needs a refill on metoprolol  xl 25 mg before her appt in October, please send to [facility]. Call pt if need be.

## 2023-12-24 ENCOUNTER — Other Ambulatory Visit (HOSPITAL_BASED_OUTPATIENT_CLINIC_OR_DEPARTMENT_OTHER): Payer: Self-pay

## 2023-12-24 MED ORDER — PREDNISONE 20 MG PO TABS
20.0000 mg | ORAL_TABLET | Freq: Every day | ORAL | 0 refills | Status: DC
  Filled 2023-12-24: qty 30, 30d supply, fill #0

## 2023-12-24 MED ORDER — PREDNISONE 20 MG PO TABS
ORAL_TABLET | ORAL | 0 refills | Status: AC
  Filled 2023-12-24: qty 35, 25d supply, fill #0

## 2023-12-25 ENCOUNTER — Other Ambulatory Visit (HOSPITAL_BASED_OUTPATIENT_CLINIC_OR_DEPARTMENT_OTHER): Payer: Self-pay

## 2023-12-26 ENCOUNTER — Other Ambulatory Visit (HOSPITAL_BASED_OUTPATIENT_CLINIC_OR_DEPARTMENT_OTHER): Payer: Self-pay

## 2023-12-26 MED ORDER — PREDNISONE 20 MG PO TABS
ORAL_TABLET | ORAL | 0 refills | Status: AC
  Filled 2023-12-26: qty 35, 25d supply, fill #0

## 2023-12-26 MED ORDER — MUPIROCIN 2 % EX OINT
TOPICAL_OINTMENT | CUTANEOUS | 0 refills | Status: AC
  Filled 2023-12-26: qty 22, 7d supply, fill #0

## 2023-12-27 ENCOUNTER — Other Ambulatory Visit (HOSPITAL_BASED_OUTPATIENT_CLINIC_OR_DEPARTMENT_OTHER): Payer: Self-pay

## 2024-02-10 ENCOUNTER — Other Ambulatory Visit (HOSPITAL_BASED_OUTPATIENT_CLINIC_OR_DEPARTMENT_OTHER): Payer: Self-pay

## 2024-02-10 MED ORDER — JARDIANCE 10 MG PO TABS
10.0000 mg | ORAL_TABLET | Freq: Every day | ORAL | 3 refills | Status: AC
  Filled 2024-02-10 – 2024-02-21 (×2): qty 90, 90d supply, fill #0
  Filled 2024-05-26: qty 90, 90d supply, fill #1

## 2024-02-11 ENCOUNTER — Other Ambulatory Visit (HOSPITAL_BASED_OUTPATIENT_CLINIC_OR_DEPARTMENT_OTHER): Payer: Self-pay

## 2024-02-11 ENCOUNTER — Other Ambulatory Visit: Payer: Self-pay

## 2024-02-11 MED ORDER — ALLOPURINOL 100 MG PO TABS
50.0000 mg | ORAL_TABLET | Freq: Every day | ORAL | 3 refills | Status: AC
  Filled 2024-02-11: qty 45, 90d supply, fill #0
  Filled 2024-05-07: qty 45, 90d supply, fill #1

## 2024-02-11 MED ORDER — LEVOTHYROXINE SODIUM 137 MCG PO TABS
137.0000 ug | ORAL_TABLET | Freq: Every morning | ORAL | 1 refills | Status: AC
  Filled 2024-02-11: qty 90, 90d supply, fill #0
  Filled 2024-05-08: qty 90, 90d supply, fill #1

## 2024-02-12 ENCOUNTER — Other Ambulatory Visit (HOSPITAL_BASED_OUTPATIENT_CLINIC_OR_DEPARTMENT_OTHER): Payer: Self-pay

## 2024-02-13 ENCOUNTER — Other Ambulatory Visit (HOSPITAL_BASED_OUTPATIENT_CLINIC_OR_DEPARTMENT_OTHER): Payer: Self-pay

## 2024-02-14 ENCOUNTER — Other Ambulatory Visit (HOSPITAL_BASED_OUTPATIENT_CLINIC_OR_DEPARTMENT_OTHER): Payer: Self-pay

## 2024-02-18 ENCOUNTER — Other Ambulatory Visit (HOSPITAL_BASED_OUTPATIENT_CLINIC_OR_DEPARTMENT_OTHER): Payer: Self-pay

## 2024-02-18 MED ORDER — BUPROPION HCL ER (XL) 150 MG PO TB24
150.0000 mg | ORAL_TABLET | Freq: Every morning | ORAL | 1 refills | Status: DC
  Filled 2024-02-18: qty 90, 90d supply, fill #0

## 2024-02-21 ENCOUNTER — Other Ambulatory Visit (HOSPITAL_BASED_OUTPATIENT_CLINIC_OR_DEPARTMENT_OTHER): Payer: Self-pay

## 2024-02-25 ENCOUNTER — Other Ambulatory Visit: Payer: Self-pay

## 2024-02-25 ENCOUNTER — Other Ambulatory Visit (HOSPITAL_BASED_OUTPATIENT_CLINIC_OR_DEPARTMENT_OTHER): Payer: Self-pay

## 2024-02-26 ENCOUNTER — Other Ambulatory Visit (HOSPITAL_BASED_OUTPATIENT_CLINIC_OR_DEPARTMENT_OTHER): Payer: Self-pay

## 2024-03-03 ENCOUNTER — Encounter (HOSPITAL_BASED_OUTPATIENT_CLINIC_OR_DEPARTMENT_OTHER): Payer: Self-pay | Admitting: Family

## 2024-03-03 ENCOUNTER — Other Ambulatory Visit (HOSPITAL_BASED_OUTPATIENT_CLINIC_OR_DEPARTMENT_OTHER): Payer: Self-pay

## 2024-03-03 ENCOUNTER — Ambulatory Visit (HOSPITAL_BASED_OUTPATIENT_CLINIC_OR_DEPARTMENT_OTHER): Admitting: Family

## 2024-03-03 VITALS — BP 128/80 | HR 77 | Ht 74.0 in | Wt 236.1 lb

## 2024-03-03 DIAGNOSIS — Z79899 Other long term (current) drug therapy: Secondary | ICD-10-CM

## 2024-03-03 DIAGNOSIS — Z8719 Personal history of other diseases of the digestive system: Secondary | ICD-10-CM | POA: Diagnosis not present

## 2024-03-03 DIAGNOSIS — I2510 Atherosclerotic heart disease of native coronary artery without angina pectoris: Secondary | ICD-10-CM | POA: Diagnosis not present

## 2024-03-03 DIAGNOSIS — Z8249 Family history of ischemic heart disease and other diseases of the circulatory system: Secondary | ICD-10-CM | POA: Diagnosis not present

## 2024-03-03 DIAGNOSIS — E785 Hyperlipidemia, unspecified: Secondary | ICD-10-CM | POA: Diagnosis not present

## 2024-03-03 MED ORDER — ROSUVASTATIN CALCIUM 10 MG PO TABS
10.0000 mg | ORAL_TABLET | ORAL | 2 refills | Status: DC
  Filled 2024-03-03: qty 15, 35d supply, fill #0
  Filled 2024-04-28: qty 15, 35d supply, fill #1

## 2024-03-03 NOTE — Progress Notes (Unsigned)
  Cardiology Office Note   Date:  03/03/2024  ID:  Diane Morales, DOB 1970-12-04, MRN [id number] PCP: Donata Snowman, PA-C  [facility] Providers Cardiologist:  None { Click to update primary MD,subspecialty MD or APP then REFRESH:1}    History of Present Illness Diane Morales is a 53 y.o. female ***with hx of HTN, HLD, family history of CAD  IGA nephropathy  Jardiance   130/80  DC'd statin   1 year ago pancreatic enzymes  Pravsatatin did not get to goal Rosuvastatin  (Crestor )   ROS: ***  Studies Reviewed      *** Risk Assessment/Calculations {Does this patient have ATRIAL FIBRILLATION?[phone]}         Physical Exam VS:  BP 128/80   Pulse 77   Ht 6' 2 (1.88 m)   Wt 236 lb 1.6 oz (107.1 kg)   SpO2 96%   BMI 30.31 kg/m        Wt Readings from Last 3 Encounters:  03/03/24 236 lb 1.6 oz (107.1 kg)  02/16/22 215 lb 6.4 oz (97.7 kg)  01/11/22 215 lb (97.5 kg)    GEN: Well nourished, well developed in no acute distress NECK: No JVD; No carotid bruits CARDIAC: ***RRR, no murmurs, rubs, gallops RESPIRATORY:  Clear to auscultation without rales, wheezing or rhonchi  ABDOMEN: Soft, non-tender, non-distended EXTREMITIES:  No edema; No deformity   ASSESSMENT AND PLAN ***    {Are you ordering a CV Procedure (e.g. stress test, cath, DCCV, TEE, etc)?   Press F2        :[id number]}  Dispo: ***  Signed, Reche GORMAN Finder, NP

## 2024-03-03 NOTE — Patient Instructions (Addendum)
 Medication Instructions:   START Rosuvastatin  10mg  three times per week  *If you need a refill on your cardiac medications before your next appointment, please call your pharmacy*  Lab Work: Your physician recommends that you return for lab work in 6-8 weeks: CMET, fasting lipid panel, lipase, amylase, lipoprotein (a)  If you have labs (blood work) drawn today and your tests are completely normal, you will receive your results only by: MyChart Message (if you have MyChart) OR A paper copy in the mail If you have any lab test that is abnormal or we need to change your treatment, we will call you to review the results.  Follow-Up: At [facility], you and your health needs are our priority.  As part of our continuing mission to provide you with exceptional heart care, our providers are all part of one team.  This team includes your primary Cardiologist (physician) and Advanced Practice Providers or APPs (Physician Assistants and Nurse Practitioners) who all work together to provide you with the care you need, when you need it.  Your next appointment:   1 year(s)  Provider:   Reche Finder, NP   We recommend signing up for the patient portal called MyChart.  Sign up information is provided on this After Visit Summary.  MyChart is used to connect with patients for Virtual Visits (Telemedicine).  Patients are able to view lab/test results, encounter notes, upcoming appointments, etc.  Non-urgent messages can be sent to your provider as well.   To learn more about what you can do with MyChart, go to [email or web address].   Other Instructions

## 2024-03-06 ENCOUNTER — Other Ambulatory Visit (HOSPITAL_BASED_OUTPATIENT_CLINIC_OR_DEPARTMENT_OTHER): Payer: Self-pay

## 2024-03-06 MED ORDER — CYCLOBENZAPRINE HCL 10 MG PO TABS
10.0000 mg | ORAL_TABLET | Freq: Every evening | ORAL | 0 refills | Status: AC | PRN
  Filled 2024-03-06: qty 30, 30d supply, fill #0

## 2024-03-11 ENCOUNTER — Other Ambulatory Visit (HOSPITAL_BASED_OUTPATIENT_CLINIC_OR_DEPARTMENT_OTHER): Payer: Self-pay

## 2024-03-20 ENCOUNTER — Other Ambulatory Visit (HOSPITAL_BASED_OUTPATIENT_CLINIC_OR_DEPARTMENT_OTHER): Payer: Self-pay

## 2024-03-20 MED ORDER — FAMCICLOVIR 500 MG PO TABS
ORAL_TABLET | ORAL | 0 refills | Status: AC
  Filled 2024-03-20: qty 30, 10d supply, fill #0

## 2024-03-30 ENCOUNTER — Encounter: Payer: Self-pay | Admitting: Radiology

## 2024-04-10 ENCOUNTER — Other Ambulatory Visit (HOSPITAL_BASED_OUTPATIENT_CLINIC_OR_DEPARTMENT_OTHER): Payer: Self-pay

## 2024-04-10 MED ORDER — METHYLPREDNISOLONE 4 MG PO TBPK
ORAL_TABLET | ORAL | 0 refills | Status: AC
  Filled 2024-04-10: qty 21, 6d supply, fill #0

## 2024-04-25 ENCOUNTER — Ambulatory Visit (HOSPITAL_BASED_OUTPATIENT_CLINIC_OR_DEPARTMENT_OTHER): Payer: Self-pay | Attending: Physician Assistant | Admitting: Physical Therapy

## 2024-04-25 ENCOUNTER — Other Ambulatory Visit: Payer: Self-pay

## 2024-04-25 ENCOUNTER — Encounter (HOSPITAL_BASED_OUTPATIENT_CLINIC_OR_DEPARTMENT_OTHER): Payer: Self-pay | Admitting: Physical Therapy

## 2024-04-25 DIAGNOSIS — M542 Cervicalgia: Secondary | ICD-10-CM | POA: Diagnosis present

## 2024-04-25 DIAGNOSIS — M62838 Other muscle spasm: Secondary | ICD-10-CM | POA: Diagnosis present

## 2024-04-25 NOTE — Therapy (Signed)
 OUTPATIENT PHYSICAL THERAPY CERVICAL EVALUATION   Patient Name: Diane Morales MRN: [id number] DOB:1971-04-09, 53 y.o., female Today's Date: 04/25/2024  END OF SESSION:  PT End of Session - 04/25/24 0805     Visit Number 1    Number of Visits 16    Date for Recertification  06/20/24          Past Medical History:  Diagnosis Date   HTN (hypertension)    Past Surgical History:  Procedure Laterality Date   CESAREAN SECTION  2006   CHOLECYSTECTOMY  05/16/2017   Patient Active Problem List   Diagnosis Date Noted   Other long term (current) drug therapy 04/16/2022   Plantar fasciitis of right foot 03/05/2022   Rotator cuff tendonitis, right 03/05/2022   Umbilical hernia 03/05/2022   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   Laryngopharyngeal reflux (LPR) 03/03/2019   Neck pain 03/03/2019   Referred otalgia of right ear 03/03/2019   Shortness of breath 03/03/2019   Essential hypertension 12/09/2018   GERD (gastroesophageal reflux disease) 12/09/2018   Hyperlipidemia 12/09/2018   Chronic constipation 01/28/2018   Overweight (BMI 25.0-29.9) 01/28/2018   S/P laparoscopic cholecystectomy 05/16/2017   Celiac disease 08/08/2011   Depression 08/08/2011   Hypothyroid 06/27/2011    PCP: Robbi Brinks PA  REFERRING PROVIDER: Robbi Brinks PA  REFERRING DIAG: Cervical spine pain   THERAPY DIAG:  No diagnosis found.  Rationale for Evaluation and Treatment: Rehabilitation  ONSET DATE: Approximately 4 months prior  SUBJECTIVE:                                                                                                                                                                                                         SUBJECTIVE STATEMENT: The patient had acute onset of cervical spine pain approximately 4 months ago.  She has no pain radiating into her arms.  The pain appears to be a little worse on the left.  She reports the pain gets  worse as the day goes on.  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  She has 2 screens that she has to look back and forth to.  She has no prior history of cervical issues. Hand dominance: Right  PERTINENT HISTORY:  Plantar fasciitis of right foot, right rotator cuff tendinitis, acid reflux  PAIN:  Are you having pain? Yes: NPRS scale: 2/10 right now, can reach a 5-6 Pain location: cervical spine L > R  Pain description: aching  Aggravating factors: as the day goes on  Relieving factors: rest   PRECAUTIONS: None  RED FLAGS: None     WEIGHT BEARING RESTRICTIONS: No  FALLS:  Has patient fallen in last 6 months? 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  DIAGNOSTIC FINDINGS:  Nothing taken   PATIENT SURVEYS:  NDI:  NECK DISABILITY INDEX  Date: 11/29 Score  Pain intensity   2. Personal care (washing, dressing, etc.)   3. Lifting   4. Reading   5. Headaches   6. Concentration   7. Work   8. Driving   9. Sleeping   10. Recreation   Total 10/50   Minimum Detectable Change (90% confidence): 5 points or 10% points  COGNITION: Overall cognitive status: Within functional limits for tasks assessed  SENSATION: WFL  POSTURE: rounded shoulders Mild   PALPATION:    CERVICAL ROM:   Active ROM A/PROM (deg) eval  Flexion 43  Extension 27   Right lateral flexion   Left lateral flexion   Right rotation 82  Left rotation 84   (Blank rows = not tested)  UPPER EXTREMITY ROM:  Active ROM Right eval Left eval  Shoulder flexion WNL WNL  Shoulder extension    Shoulder abduction    Shoulder adduction    Shoulder extension    Shoulder internal rotation WNL WNL  Shoulder external  rotation WNL WNL  Elbow flexion    Elbow extension    Wrist flexion    Wrist extension    Wrist ulnar deviation    Wrist radial deviation    Wrist pronation    Wrist supination     (Blank rows = not tested)  UPPER EXTREMITY MMT:  MMT Right eval Left eval  Shoulder flexion 5 5  Shoulder extension    Shoulder abduction    Shoulder adduction    Shoulder extension    Shoulder internal rotation 5 5  Shoulder external rotation 5 5  Middle trapezius    Lower trapezius    Elbow flexion    Elbow extension    Wrist flexion    Wrist extension    Wrist ulnar deviation    Wrist radial deviation    Wrist pronation    Wrist supination    Grip strength     (Blank rows = not tested)   TREATMENT DATE:                                                                                                                               Manual:  Trigger point release to upper trap and cervical spine  Manual traction  Suboccipital release   There-ex:  Upper trap stretch 2 x 20 seconds each side Levator stretch 2 x 20 seconds each side  Scap retraction green 2x 10 cueing Shoulder extension green 2 x 10  PATIENT EDUCATION:  Education details: HEP, symptom management Person educated: Patient Education method: Explanation, Demonstration, Tactile cues, Verbal cues, and Handouts Education comprehension: verbalized understanding, returned demonstration, verbal cues required, tactile cues required, and needs further education  HOME EXERCISE PROGRAM: Access Code: [email or web address] URL: [email or web address] Date: 04/25/2024 Prepared by: Alm Don  Exercises - Theracane Over Shoulder  - 1 x daily - 7 x weekly - 3 sets - 10 reps - Shoulder extension with resistance - Neutral  - 1 x daily - 7 x weekly - 3 sets - 10 reps - Scapular Retraction with Resistance  - 1 x daily - 7 x weekly - 3 sets - 10 reps - Gentle Levator Scapulae Stretch  - 1 x daily - 7 x weekly - 3 sets - 10 reps -  Seated Upper Trapezius Stretch  - 1 x daily - 7 x weekly - 3 sets - 10 reps  ASSESSMENT:  CLINICAL IMPRESSION: Patient is a 53 year old female with acute onset of cervical spine pain beginning approximately 4 months prior.  She has increased pain as the day goes on at work.  She uses a double screen at work and has to turn her head frequently.  She has no significant limitations in cervical rotation.  She has a mild limitation in cervical extension.  She has full active range of motion and strength of her upper extremities.  She has significant spasming in bilateral upper traps  extending into her cervicals paraspinals.  We reviewed self soft tissue mobilization using a Thera cane.  We also reviewed stretches that she can use at work.  Therapy will develop a program for posterior chain strengthening.  We also suggested that she have workplace ergonomic assessments through [employer].  We gave her the paperwork to submit for an ergonomic assessment.  Therapy will continue to progress program as tolerated.  OBJECTIVE IMPAIRMENTS: decreased activity tolerance, increased fascial restrictions, impaired UE functional use, and pain.   ACTIVITY LIMITATIONS: carrying, lifting, and reach over head  PARTICIPATION LIMITATIONS: meal prep, cleaning, community activity, and occupation  PERSONAL FACTORS: None   REHAB POTENTIAL: Good  CLINICAL DECISION MAKING: Stable/uncomplicated  EVALUATION COMPLEXITY: Low   GOALS: Goals reviewed with patient? Yes  SHORT TERM GOALS: Target date:  05/23/2024    Patient will report a 50% reduction in tenderness to palpation bilateral  Baseline:  Goal status: INITIAL  2.  Patient will increase cervical extension to 30 degrees  Baseline:  Goal status: INITIAL  3.  Patient will have a work place assessment performed  Baseline:  Goal status: INITIAL  LONG TERM GOALS: Target date: 06/20/2024    Patient will sleep through the night without pain  Baseline:   Goal status: INITIAL  2.  Patient will perform work tasks without pain  Baseline:  Goal status: INITIAL  3.  Patient will be independent with gym program for posterior chain strengthening  Baseline:  Goal status: INITIAL    PLAN:  PT FREQUENCY: 1-2x/week  PT DURATION: 8 weeks  PLANNED INTERVENTIONS: 97164- PT Re-evaluation, 97110-Therapeutic exercises, 97530- Therapeutic activity, 97112- Neuromuscular re-education, 97535- Self Care, [id number]- Manual therapy, [id number]- Electrical stimulation (unattended), Patient/Family education, Joint mobilization, Spinal mobilization, Cryotherapy, and Moist heat  PLAN FOR NEXT SESSION:  Consider trigger point dry needling to bilateral upper traps and cervical paraspinals.  Consider further soft tissue mobilization and manual therapy to cervical spine.  Review posterior chain strengthening in the gym.  Use RPE to grade exercises.  Consider bilateral ER.  Upgrade bands if necessary.    Alm JINNY Don, PT 04/25/2024, 8:05 AM

## 2024-04-28 ENCOUNTER — Other Ambulatory Visit (HOSPITAL_BASED_OUTPATIENT_CLINIC_OR_DEPARTMENT_OTHER): Payer: Self-pay

## 2024-04-29 ENCOUNTER — Other Ambulatory Visit (HOSPITAL_BASED_OUTPATIENT_CLINIC_OR_DEPARTMENT_OTHER): Payer: Self-pay

## 2024-04-29 MED ORDER — PANTOPRAZOLE SODIUM 40 MG PO TBEC
40.0000 mg | DELAYED_RELEASE_TABLET | Freq: Every day | ORAL | 1 refills | Status: AC
  Filled 2024-04-29: qty 90, 90d supply, fill #0

## 2024-04-29 MED ORDER — BUPROPION HCL ER (XL) 150 MG PO TB24
150.0000 mg | ORAL_TABLET | Freq: Every morning | ORAL | 1 refills | Status: AC
  Filled 2024-04-29: qty 90, 90d supply, fill #0

## 2024-04-29 MED ORDER — OLMESARTAN MEDOXOMIL 20 MG PO TABS
20.0000 mg | ORAL_TABLET | Freq: Every day | ORAL | 1 refills | Status: AC
  Filled 2024-04-29: qty 90, 90d supply, fill #0

## 2024-05-01 ENCOUNTER — Encounter (HOSPITAL_BASED_OUTPATIENT_CLINIC_OR_DEPARTMENT_OTHER): Payer: Self-pay | Admitting: Physical Therapy

## 2024-05-01 ENCOUNTER — Ambulatory Visit (HOSPITAL_BASED_OUTPATIENT_CLINIC_OR_DEPARTMENT_OTHER): Attending: Physician Assistant | Admitting: Physical Therapy

## 2024-05-01 DIAGNOSIS — M79671 Pain in right foot: Secondary | ICD-10-CM | POA: Insufficient documentation

## 2024-05-01 DIAGNOSIS — M542 Cervicalgia: Secondary | ICD-10-CM | POA: Insufficient documentation

## 2024-05-01 DIAGNOSIS — M62838 Other muscle spasm: Secondary | ICD-10-CM | POA: Diagnosis present

## 2024-05-01 DIAGNOSIS — R262 Difficulty in walking, not elsewhere classified: Secondary | ICD-10-CM | POA: Diagnosis present

## 2024-05-01 NOTE — Therapy (Signed)
 OUTPATIENT PHYSICAL THERAPY CERVICAL EVALUATION   Patient Name: Diane Morales MRN: [id number] DOB:[birth year], 53 y.o., female Today's Date: 05/01/2024  END OF SESSION:    Past Medical History:  Diagnosis Date   HTN (hypertension)    Past Surgical History:  Procedure Laterality Date   CESAREAN SECTION  2006   CHOLECYSTECTOMY  05/16/2017   Patient Active Problem List   Diagnosis Date Noted   Other long term (current) drug therapy 04/16/2022   Plantar fasciitis of right foot 03/05/2022   Rotator cuff tendonitis, right 03/05/2022   Umbilical hernia 03/05/2022   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   Laryngopharyngeal reflux (LPR) 03/03/2019   Neck pain 03/03/2019   Referred otalgia of right ear 03/03/2019   Shortness of breath 03/03/2019   Essential hypertension 12/09/2018   GERD (gastroesophageal reflux disease) 12/09/2018   Hyperlipidemia 12/09/2018   Chronic constipation 01/28/2018   Overweight (BMI 25.0-29.9) 01/28/2018   S/P laparoscopic cholecystectomy 05/16/2017   Celiac disease 08/08/2011   Depression 08/08/2011   Hypothyroid 06/27/2011    PCP: Robbi Brinks PA  REFERRING PROVIDER: Robbi Brinks PA  REFERRING DIAG: Cervical spine pain   THERAPY DIAG:  No diagnosis found.  Rationale for Evaluation and Treatment: Rehabilitation  ONSET DATE: Approximately 4 months prior  SUBJECTIVE:                                                                                                                                                                                                         SUBJECTIVE STATEMENT: The patient feels like it may be a little better.   Eval: The patient had acute onset of cervical spine pain approximately 4 months ago.  She has no pain radiating into her arms.  The pain appears to be a little worse on the left.  She reports the pain gets worse as the day goes on.  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   She has 2 screens that she has to look back and forth to.  She has no prior history of cervical issues. Hand dominance: Right  PERTINENT HISTORY:  Plantar fasciitis of right foot, right rotator cuff tendinitis, acid reflux  PAIN:  Are you having pain? Yes: NPRS scale: 2/10 right now, can reach a 5-6 Pain location: cervical spine L > R  Pain description: aching  Aggravating factors: as the day goes on  Relieving factors: rest   PRECAUTIONS: None  RED FLAGS: None     WEIGHT BEARING RESTRICTIONS: No  FALLS:  Has patient fallen in last 6 months? 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  DIAGNOSTIC FINDINGS:  Nothing taken   PATIENT SURVEYS:  NDI:  NECK DISABILITY INDEX  Date: 11/29 Score  Pain intensity   2. Personal care (washing, dressing, etc.)   3. Lifting   4. Reading   5. Headaches   6. Concentration   7. Work   8. Driving   9. Sleeping   10. Recreation   Total 10/50   Minimum Detectable Change (90% confidence): 5 points or 10% points  COGNITION: Overall cognitive status: Within functional limits for tasks assessed  SENSATION: WFL  POSTURE: rounded shoulders Mild   PALPATION:    CERVICAL ROM:   Active ROM A/PROM (deg) eval  Flexion 43  Extension 27   Right lateral flexion   Left lateral flexion   Right rotation 82  Left rotation 84   (Blank rows = not tested)  UPPER EXTREMITY ROM:  Active ROM Right eval Left eval  Shoulder flexion WNL WNL  Shoulder extension    Shoulder abduction    Shoulder adduction    Shoulder extension    Shoulder internal rotation WNL WNL  Shoulder external rotation WNL WNL  Elbow flexion    Elbow extension    Wrist flexion     Wrist extension    Wrist ulnar deviation    Wrist radial deviation    Wrist pronation    Wrist supination     (Blank rows = not tested)  UPPER EXTREMITY MMT:  MMT Right eval Left eval  Shoulder flexion 5 5  Shoulder extension    Shoulder abduction    Shoulder adduction    Shoulder extension    Shoulder internal rotation 5 5  Shoulder external rotation 5 5  Middle trapezius    Lower trapezius    Elbow flexion    Elbow extension    Wrist flexion    Wrist extension    Wrist ulnar deviation    Wrist radial deviation    Wrist pronation    Wrist supination    Grip strength     (Blank rows = not tested)   TREATMENT DATE:                                                                                                                               12/5 Manual:  Trigger point release to upper trap and cervical spine  Manual traction  Suboccipital release   Neuro-re-ed:  Bilateral ER x10 red RPE 4  2x10 RPE of 5   Horizontal Abduction red x10 RPE of 4 moved to greex 2x10 RPE of 5   Row cable 20 lbs 3x12    Eval Manual:  Trigger point release to upper trap and cervical spine  Manual traction  Suboccipital release   There-ex:  Upper trap stretch 2 x 20 seconds each side Levator stretch 2 x 20 seconds each side Scap retraction green 2x 10 cueing Shoulder extension green 2 x 10  PATIENT EDUCATION:  Education details: HEP, symptom management Person educated: Patient Education method: Explanation, Demonstration, Tactile cues, Verbal cues, and Handouts Education comprehension: verbalized understanding, returned demonstration, verbal cues required, tactile cues required, and needs further education  HOME EXERCISE PROGRAM: Access Code: [email or web address] URL: [email or web address] Date: 04/25/2024 Prepared by: Alm Don  Exercises - Theracane Over Shoulder  - 1 x daily - 7 x weekly - 3 sets - 10 reps - Shoulder extension with resistance - Neutral  - 1 x  daily - 7 x weekly - 3 sets - 10 reps - Scapular Retraction with Resistance  - 1 x daily - 7 x weekly - 3 sets - 10 reps - Gentle Levator Scapulae Stretch  - 1 x daily - 7 x weekly - 3 sets - 10 reps - Seated Upper Trapezius Stretch  - 1 x daily - 7 x weekly - 3 sets - 10 reps  ASSESSMENT:  CLINICAL IMPRESSION: The patient continues to have a large spasm in her upper trap but it is slightly better. We reviewed a progression of home exercises. We also reviewed how to perform home exercise given last visit at the gym. We reviewed how to grade her exercises at home and in the gym. She was advised to keep her shoulders down.    Eval:Patient is a 53 year old female with acute onset of cervical spine pain beginning approximately 4 months prior.  She has increased pain as the day goes on at work.  She uses a double screen at work and has to turn her head frequently.  She has no significant limitations in cervical rotation.  She has a mild limitation in cervical extension.  She has full active range of motion and strength of her upper extremities.  She has significant spasming in bilateral upper traps  extending into her cervicals paraspinals.  We reviewed self soft tissue mobilization using a Thera cane.  We also reviewed stretches that she can use at work.  Therapy will develop a program for posterior chain strengthening.  We also suggested that she have workplace ergonomic assessments through [employer].  We gave her the paperwork to submit for an ergonomic assessment.  Therapy will continue to progress program as tolerated.  OBJECTIVE IMPAIRMENTS: decreased activity tolerance, increased fascial restrictions, impaired UE functional use, and pain.   ACTIVITY LIMITATIONS: carrying, lifting, and reach over head  PARTICIPATION LIMITATIONS: meal prep, cleaning, community activity, and occupation  PERSONAL FACTORS: None   REHAB POTENTIAL: Good  CLINICAL DECISION MAKING: Stable/uncomplicated  EVALUATION  COMPLEXITY: Low   GOALS: Goals reviewed with patient? Yes  SHORT TERM GOALS: Target date:  05/23/2024    Patient will report a 50% reduction in tenderness to palpation bilateral  Baseline:  Goal status: INITIAL  2.  Patient will increase cervical extension to 30 degrees  Baseline:  Goal status: INITIAL  3.  Patient will have a work place assessment performed  Baseline:  Goal status: INITIAL  LONG TERM GOALS: Target date: 06/20/2024    Patient will sleep through the night without pain  Baseline:  Goal status: INITIAL  2.  Patient will perform work tasks without pain  Baseline:  Goal status: INITIAL  3.  Patient will be independent  with gym program for posterior chain strengthening  Baseline:  Goal status: INITIAL    PLAN:  PT FREQUENCY: 1-2x/week  PT DURATION: 8 weeks  PLANNED INTERVENTIONS: 97164- PT Re-evaluation, 97110-Therapeutic exercises, 97530- Therapeutic activity, 97112- Neuromuscular re-education, 97535- Self Care, 02859- Manual therapy, G0283- Electrical stimulation (unattended), Patient/Family education, Joint mobilization, Spinal mobilization, Cryotherapy, and Moist heat  PLAN FOR NEXT SESSION:  Consider trigger point dry needling to bilateral upper traps and cervical paraspinals.  Consider further soft tissue mobilization and manual therapy to cervical spine.  Review posterior chain strengthening in the gym.  Use RPE to grade exercises.  Consider bilateral ER.  Upgrade bands if necessary.    Alm JINNY Don, PT 05/01/2024, 12:11 PM

## 2024-05-07 ENCOUNTER — Ambulatory Visit (HOSPITAL_BASED_OUTPATIENT_CLINIC_OR_DEPARTMENT_OTHER): Payer: Self-pay | Admitting: Physical Therapy

## 2024-05-07 ENCOUNTER — Encounter (HOSPITAL_BASED_OUTPATIENT_CLINIC_OR_DEPARTMENT_OTHER): Payer: Self-pay | Admitting: Physical Therapy

## 2024-05-07 DIAGNOSIS — M542 Cervicalgia: Secondary | ICD-10-CM

## 2024-05-07 DIAGNOSIS — M62838 Other muscle spasm: Secondary | ICD-10-CM

## 2024-05-07 NOTE — Therapy (Signed)
 OUTPATIENT PHYSICAL THERAPY CERVICAL TREATMENT   Patient Name: Diane Morales MRN: [id number] DOB:22-Mar-1971, 53 y.o., female Today's Date: 05/08/2024  END OF SESSION:  PT End of Session - 05/07/24 0815     Visit Number 3    Number of Visits 16    Date for Recertification  06/20/24    Authorization Type BCBS    PT Start Time 0804    PT Stop Time 0853    PT Time Calculation (min) 49 min    Activity Tolerance Patient tolerated treatment well    Behavior During Therapy [facility] for tasks assessed/performed           Past Medical History:  Diagnosis Date   HTN (hypertension)    Past Surgical History:  Procedure Laterality Date   CESAREAN SECTION  2006   CHOLECYSTECTOMY  05/16/2017   Patient Active Problem List   Diagnosis Date Noted   Other long term (current) drug therapy 04/16/2022   Plantar fasciitis of right foot 03/05/2022   Rotator cuff tendonitis, right 03/05/2022   Umbilical hernia 03/05/2022   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   Laryngopharyngeal reflux (LPR) 03/03/2019   Neck pain 03/03/2019   Referred otalgia of right ear 03/03/2019   Shortness of breath 03/03/2019   Essential hypertension 12/09/2018   GERD (gastroesophageal reflux disease) 12/09/2018   Hyperlipidemia 12/09/2018   Chronic constipation 01/28/2018   Overweight (BMI 25.0-29.9) 01/28/2018   S/P laparoscopic cholecystectomy 05/16/2017   Celiac disease 08/08/2011   Depression 08/08/2011   Hypothyroid 06/27/2011    PCP: Robbi Brinks PA  REFERRING PROVIDER: Robbi Brinks PA  REFERRING DIAG: Cervical spine pain   THERAPY DIAG:  Cervicalgia  Other muscle spasm  Rationale for Evaluation and Treatment: Rehabilitation  ONSET DATE: Approximately 4 months prior  SUBJECTIVE:                                                                                                                                                                                                          SUBJECTIVE STATEMENT: Pt denies any adverse effects after prior treatment.  Pt has a double screen and turns her head a lot during the day.  She has raised and lowered the monitor and had no change.  Pt states the set up she has doesn't allow for a standing desk.   Pt hasn't contacted the ergonomic team.  Pt states she is sore and stiff at the end of the day.  Pt reports compliance with HEP and has been using the theracane.  She has a theracane  at work and at home.  Pt performed rows and shoulder extension last night with the cable in the gym.   Hand dominance: Right  PERTINENT HISTORY:  Plantar fasciitis of right foot, right rotator cuff tendinitis, acid reflux, HTN  PAIN:  Are you having pain? Yes: NPRS scale: 2-3/10 right now, can reach a 5-6/10 Pain location: cervical spine L > R  Pain description: aching  Aggravating factors: as the day goes on  Relieving factors: rest   PRECAUTIONS: None  RED FLAGS: None     WEIGHT BEARING RESTRICTIONS: No  FALLS:  Has patient fallen in last 6 months? 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  DIAGNOSTIC FINDINGS:  Nothing taken   PATIENT SURVEYS:  NDI:  NECK DISABILITY INDEX  Date: 11/29 Score  Pain intensity   2. Personal care (washing, dressing, etc.)   3. Lifting   4. Reading   5. Headaches   6. Concentration   7. Work   8. Driving   9. Sleeping   10. Recreation   Total 10/50   Minimum Detectable Change (90% confidence): 5 points or 10% points  COGNITION: Overall cognitive status: Within functional limits for tasks assessed  SENSATION: WFL  POSTURE: rounded shoulders Mild   PALPATION:    CERVICAL ROM:   Active ROM  A/PROM (deg) eval  Flexion 43  Extension 27   Right lateral flexion   Left lateral flexion   Right rotation 82  Left rotation 84   (Blank rows = not tested)  UPPER EXTREMITY ROM:  Active ROM Right eval Left eval  Shoulder flexion WNL WNL  Shoulder extension    Shoulder abduction    Shoulder adduction    Shoulder extension    Shoulder internal rotation WNL WNL  Shoulder external rotation WNL WNL  Elbow flexion    Elbow extension    Wrist flexion    Wrist extension    Wrist ulnar deviation    Wrist radial deviation    Wrist pronation    Wrist supination     (Blank rows = not tested)  UPPER EXTREMITY MMT:  MMT Right eval Left eval  Shoulder flexion 5 5  Shoulder extension    Shoulder abduction    Shoulder adduction    Shoulder extension    Shoulder internal rotation 5 5  Shoulder external rotation 5 5  Middle trapezius    Lower trapezius    Elbow flexion    Elbow extension    Wrist flexion    Wrist extension    Wrist ulnar deviation    Wrist radial deviation    Wrist pronation    Wrist supination    Grip strength     (Blank rows = not tested)   TREATMENT DATE:  12/11 Manual:  STM to bilat cervical paraspinals and suboccipitals, suboccipital release in supine  Manual cervical distraction in supine  STM and TPR to bilat UT seated  Neuro-re-ed:  Reviewed current function, HEP compliance, pain level, and response to prior treatment. Bilateral ER x10 RTB, GTB 2x10 Horizontal Abduction RTB x10, GTB 2x10   12/5 Manual:  Trigger point release to upper trap and cervical spine  Manual traction  Suboccipital release   Neuro-re-ed:  Bilateral ER x10 red RPE 4  2x10 RPE of 5   Horizontal Abduction red x10 RPE of 4 moved to greex 2x10 RPE of 5   Row cable 20 lbs 3x12    Eval Manual:  Trigger point release to upper trap and  cervical spine  Manual traction  Suboccipital release   There-ex:  Upper trap stretch 2 x 20 seconds each side Levator stretch 2 x 20 seconds each side Scap retraction green 2x 10 cueing Shoulder extension green 2 x 10  PATIENT EDUCATION:  Education details: HEP, symptom management, exercise form Person educated: Patient Education method: Explanation, Demonstration, Tactile cues, Verbal cues, and Handouts Education comprehension: verbalized understanding, returned demonstration, verbal cues required, tactile cues required, and needs further education  HOME EXERCISE PROGRAM: Access Code: [email or web address] URL: [email or web address] Date: 04/25/2024 Prepared by: Alm Don  Exercises - Theracane Over Shoulder  - 1 x daily - 7 x weekly - 3 sets - 10 reps - Shoulder extension with resistance - Neutral  - 1 x daily - 7 x weekly - 3 sets - 10 reps - Scapular Retraction with Resistance  - 1 x daily - 7 x weekly - 3 sets - 10 reps - Gentle Levator Scapulae Stretch  - 1 x daily - 7 x weekly - 3 sets - 10 reps - Seated Upper Trapezius Stretch  - 1 x daily - 7 x weekly - 3 sets - 10 reps  ASSESSMENT:  CLINICAL IMPRESSION: Pt has soft tissue tightness in cervical paraspinals and UT and trigger points in UT.  PT performed STM and TPR to improve soft tissue tightness and mobility and to reduce pain and myofascial restrictions.  Pt is compliant with HEP and has been using the theracane.  She performed scapular exercises on the cable in the gym last night.  Pt performed standing theraband exercises to improve postural and scapular strength and stability.  She tolerated exercises and manual therapy well.  Pt had no c/o's and reports improved stiffness after treatment.  Pt should benefit from cont skilled PT to address impairments and goals and to assist in restoring PLOF.    OBJECTIVE IMPAIRMENTS: decreased activity tolerance, increased fascial restrictions, impaired UE functional use, and  pain.   ACTIVITY LIMITATIONS: carrying, lifting, and reach over head  PARTICIPATION LIMITATIONS: meal prep, cleaning, community activity, and occupation  PERSONAL FACTORS: None   REHAB POTENTIAL: Good  CLINICAL DECISION MAKING: Stable/uncomplicated  EVALUATION COMPLEXITY: Low   GOALS: Goals reviewed with patient? Yes  SHORT TERM GOALS: Target date:  05/23/2024    Patient will report a 50% reduction in tenderness to palpation bilateral  Baseline:  Goal status: INITIAL  2.  Patient will increase cervical extension to 30 degrees  Baseline:  Goal status: INITIAL  3.  Patient will have a work place assessment performed  Baseline:  Goal status: INITIAL  LONG TERM GOALS: Target date: 06/20/2024    Patient will sleep through the night without pain  Baseline:  Goal status: INITIAL  2.  Patient will perform work  tasks without pain  Baseline:  Goal status: INITIAL  3.  Patient will be independent with gym program for posterior chain strengthening  Baseline:  Goal status: INITIAL    PLAN:  PT FREQUENCY: 1-2x/week  PT DURATION: 8 weeks  PLANNED INTERVENTIONS: 97164- PT Re-evaluation, 97110-Therapeutic exercises, 97530- Therapeutic activity, 97112- Neuromuscular re-education, 97535- Self Care, 02859- Manual therapy, G0283- Electrical stimulation (unattended), Patient/Family education, Joint mobilization, Spinal mobilization, Cryotherapy, and Moist heat  PLAN FOR NEXT SESSION:  Consider trigger point dry needling to bilateral upper traps and cervical paraspinals.  Consider further soft tissue mobilization and manual therapy to cervical spine.  Review posterior chain strengthening in the gym.  Use RPE to grade exercises.  Consider bilateral ER.  Upgrade bands if necessary.   Leigh Minerva III PT, DPT 05/08/2024 1:25 PM

## 2024-05-14 ENCOUNTER — Ambulatory Visit (HOSPITAL_BASED_OUTPATIENT_CLINIC_OR_DEPARTMENT_OTHER): Payer: Self-pay | Admitting: Physical Therapy

## 2024-05-14 DIAGNOSIS — M542 Cervicalgia: Secondary | ICD-10-CM

## 2024-05-14 DIAGNOSIS — M62838 Other muscle spasm: Secondary | ICD-10-CM

## 2024-05-14 NOTE — Therapy (Signed)
 OUTPATIENT PHYSICAL THERAPY CERVICAL TREATMENT   Patient Name: Diane Morales MRN: [id number] DOB:01/03/1971, 53 y.o., female Today's Date: 05/14/2024  END OF SESSION:  PT End of Session - 05/14/24 1508     Visit Number 4    Number of Visits 16    Date for Recertification  06/20/24    Authorization Type BCBS    PT Start Time 1500           Past Medical History:  Diagnosis Date   HTN (hypertension)    Past Surgical History:  Procedure Laterality Date   CESAREAN SECTION  2006   CHOLECYSTECTOMY  05/16/2017   Patient Active Problem List   Diagnosis Date Noted   Other long term (current) drug therapy 04/16/2022   Plantar fasciitis of right foot 03/05/2022   Rotator cuff tendonitis, right 03/05/2022   Umbilical hernia 03/05/2022   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   Laryngopharyngeal reflux (LPR) 03/03/2019   Neck pain 03/03/2019   Referred otalgia of right ear 03/03/2019   Shortness of breath 03/03/2019   Essential hypertension 12/09/2018   GERD (gastroesophageal reflux disease) 12/09/2018   Hyperlipidemia 12/09/2018   Chronic constipation 01/28/2018   Overweight (BMI 25.0-29.9) 01/28/2018   S/P laparoscopic cholecystectomy 05/16/2017   Celiac disease 08/08/2011   Depression 08/08/2011   Hypothyroid 06/27/2011    PCP: Robbi Brinks PA  REFERRING PROVIDER: Robbi Brinks PA  REFERRING DIAG: Cervical spine pain   THERAPY DIAG:  No diagnosis found.  Rationale for Evaluation and Treatment: Rehabilitation  ONSET DATE: Approximately 4 months prior  SUBJECTIVE:                                                                                                                                                                                                         SUBJECTIVE STATEMENT: Pt denies any adverse effects after prior treatment.  Pt had increased pain when traveling on a plane this past weekend.  t has a double screen and  turns her head a lot during the day.  She has raised and lowered the monitor and had no change.  Pt states the set up she has doesn't allow for a standing desk.   Pt hasn't contacted the ergonomic team.  Pt states she is sore and stiff at the end of the day.  Pt reports compliance with HEP and has been using the theracane.  She has a theracane at work and at home.  Pt performed rows and shoulder extension last night with the cable in the gym.   Hand dominance:  Right  PERTINENT HISTORY:  Plantar fasciitis of right foot, right rotator cuff tendinitis, acid reflux, HTN  PAIN:  Are you having pain? Yes: NPRS scale: 2/10 right now, can reach a 5-6/10 Pain location: cervical spine and UT, L > R  Pain description: aching  Aggravating factors: as the day goes on  Relieving factors: rest   PRECAUTIONS: None  RED FLAGS: None     WEIGHT BEARING RESTRICTIONS: No  FALLS:  Has patient fallen in last 6 months? 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  DIAGNOSTIC FINDINGS:  Nothing taken   PATIENT SURVEYS:  NDI:  NECK DISABILITY INDEX  Date: 11/29 Score  Pain intensity   2. Personal care (washing, dressing, etc.)   3. Lifting   4. Reading   5. Headaches   6. Concentration   7. Work   8. Driving   9. Sleeping   10. Recreation   Total 10/50   Minimum Detectable Change (90% confidence): 5 points or 10% points  COGNITION: Overall cognitive status: Within functional limits for tasks assessed  SENSATION: WFL  POSTURE: rounded shoulders Mild   PALPATION:    CERVICAL ROM:   Active ROM A/PROM (deg) eval  Flexion 43  Extension 27   Right lateral flexion   Left lateral flexion   Right rotation  82  Left rotation 84   (Blank rows = not tested)  UPPER EXTREMITY ROM:  Active ROM Right eval Left eval  Shoulder flexion WNL WNL  Shoulder extension    Shoulder abduction    Shoulder adduction    Shoulder extension    Shoulder internal rotation WNL WNL  Shoulder external rotation WNL WNL  Elbow flexion    Elbow extension    Wrist flexion    Wrist extension    Wrist ulnar deviation    Wrist radial deviation    Wrist pronation    Wrist supination     (Blank rows = not tested)  UPPER EXTREMITY MMT:  MMT Right eval Left eval  Shoulder flexion 5 5  Shoulder extension    Shoulder abduction    Shoulder adduction    Shoulder extension    Shoulder internal rotation 5 5  Shoulder external rotation 5 5  Middle trapezius    Lower trapezius    Elbow flexion    Elbow extension    Wrist flexion    Wrist extension    Wrist ulnar deviation    Wrist radial deviation    Wrist pronation    Wrist supination    Grip strength     (Blank rows = not tested)   TREATMENT DATE:  12/18 Manual:  Static cupping to bilat UT seated STM to bilat cervical paraspinals and suboccipitals, suboccipital release in supine  Manual cervical distraction in supine   Row cable 20 lbs 3x12  Standing cable extensions  15# 3x12 Horizontal Abduction GTB 2x12    12/11 Manual:  STM to bilat cervical paraspinals and suboccipitals, suboccipital release in supine  Manual cervical distraction in supine  STM and TPR to bilat UT seated  Neuro-re-ed:  Reviewed current function, HEP compliance, pain level, and response to prior treatment. Bilateral ER x10 RTB, GTB 2x10 Horizontal Abduction RTB x10, GTB 2x10   12/5 Manual:  Trigger point release to upper trap and cervical spine  Manual traction  Suboccipital release   Neuro-re-ed:  Bilateral ER x10 red RPE 4  2x10 RPE of  5   Horizontal Abduction red x10 RPE of 4 moved to greex 2x10 RPE of 5   Row cable 20 lbs 3x12    Eval Manual:  Trigger point release to upper trap and cervical spine  Manual traction  Suboccipital release   There-ex:  Upper trap stretch 2 x 20 seconds each side Levator stretch 2 x 20 seconds each side Scap retraction green 2x 10 cueing Shoulder extension green 2 x 10  PATIENT EDUCATION:  Education details: HEP, symptom management, exercise form Person educated: Patient Education method: Explanation, Demonstration, Tactile cues, Verbal cues, and Handouts Education comprehension: verbalized understanding, returned demonstration, verbal cues required, tactile cues required, and needs further education  HOME EXERCISE PROGRAM: Access Code: [email or web address] URL: [email or web address] Date: 04/25/2024 Prepared by: Alm Don  Exercises - Theracane Over Shoulder  - 1 x daily - 7 x weekly - 3 sets - 10 reps - Shoulder extension with resistance - Neutral  - 1 x daily - 7 x weekly - 3 sets - 10 reps - Scapular Retraction with Resistance  - 1 x daily - 7 x weekly - 3 sets - 10 reps - Gentle Levator Scapulae Stretch  - 1 x daily - 7 x weekly - 3 sets - 10 reps - Seated Upper Trapezius Stretch  - 1 x daily - 7 x weekly - 3 sets - 10 reps  ASSESSMENT:  CLINICAL IMPRESSION: Pt has soft tissue tightness in cervical paraspinals and UT and trigger points in UT.  PT performed STM and TPR to improve soft tissue tightness and mobility and to reduce pain and myofascial restrictions.  Pt is compliant with HEP and has been using the theracane.  She performed scapular exercises on the cable in the gym last night.  Pt performed standing theraband exercises to improve postural and scapular strength and stability.  She tolerated exercises and manual therapy well.  Pt had no c/o's and reports improved stiffness after treatment.  Pt should benefit from cont skilled PT to address impairments and  goals and to assist in restoring PLOF.    OBJECTIVE IMPAIRMENTS: decreased activity tolerance, increased fascial restrictions, impaired UE functional use, and pain.   ACTIVITY LIMITATIONS: carrying, lifting, and reach over head  PARTICIPATION LIMITATIONS: meal prep, cleaning, community activity, and occupation  PERSONAL FACTORS: None   REHAB POTENTIAL: Good  CLINICAL DECISION MAKING: Stable/uncomplicated  EVALUATION COMPLEXITY: Low   GOALS: Goals reviewed with patient? Yes  SHORT TERM GOALS: Target date:  05/23/2024    Patient will report a 50% reduction in tenderness to palpation bilateral  Baseline:  Goal status: INITIAL  2.  Patient will increase cervical extension to 30 degrees  Baseline:  Goal status: INITIAL  3.  Patient will have a work place assessment performed  Baseline:  Goal status: INITIAL  LONG TERM GOALS: Target date: 06/20/2024    Patient will sleep through the night without pain  Baseline:  Goal status: INITIAL  2.  Patient will perform work tasks without pain  Baseline:  Goal status: INITIAL  3.  Patient will be independent with gym program for posterior chain strengthening  Baseline:  Goal status: INITIAL    PLAN:  PT FREQUENCY: 1-2x/week  PT DURATION: 8 weeks  PLANNED INTERVENTIONS: 97164- PT Re-evaluation, 97110-Therapeutic exercises, 97530- Therapeutic activity, 97112- Neuromuscular re-education, 97535- Self Care, 02859- Manual therapy, G0283- Electrical stimulation (unattended), Patient/Family education, Joint mobilization, Spinal mobilization, Cryotherapy, and Moist heat  PLAN FOR NEXT SESSION:  Consider trigger point dry needling to bilateral upper traps and cervical paraspinals.  Consider further soft tissue mobilization and manual therapy to cervical spine.  Review posterior chain strengthening in the gym.  Use RPE to grade exercises.  Consider bilateral ER.  Upgrade bands if necessary.   Leigh Minerva III PT,  DPT 05/14/2024 3:08 PM

## 2024-05-18 ENCOUNTER — Encounter (HOSPITAL_BASED_OUTPATIENT_CLINIC_OR_DEPARTMENT_OTHER): Payer: Self-pay

## 2024-05-19 ENCOUNTER — Ambulatory Visit (HOSPITAL_BASED_OUTPATIENT_CLINIC_OR_DEPARTMENT_OTHER): Admitting: Physical Therapy

## 2024-05-19 ENCOUNTER — Ambulatory Visit (HOSPITAL_BASED_OUTPATIENT_CLINIC_OR_DEPARTMENT_OTHER): Payer: Self-pay | Admitting: Family

## 2024-05-19 ENCOUNTER — Encounter (HOSPITAL_BASED_OUTPATIENT_CLINIC_OR_DEPARTMENT_OTHER): Payer: Self-pay | Admitting: Physical Therapy

## 2024-05-19 DIAGNOSIS — E785 Hyperlipidemia, unspecified: Secondary | ICD-10-CM

## 2024-05-19 DIAGNOSIS — Z8719 Personal history of other diseases of the digestive system: Secondary | ICD-10-CM

## 2024-05-19 DIAGNOSIS — Z79899 Other long term (current) drug therapy: Secondary | ICD-10-CM

## 2024-05-19 DIAGNOSIS — Z8249 Family history of ischemic heart disease and other diseases of the circulatory system: Secondary | ICD-10-CM

## 2024-05-19 DIAGNOSIS — Z789 Other specified health status: Secondary | ICD-10-CM

## 2024-05-19 DIAGNOSIS — M542 Cervicalgia: Secondary | ICD-10-CM | POA: Diagnosis not present

## 2024-05-19 DIAGNOSIS — I2510 Atherosclerotic heart disease of native coronary artery without angina pectoris: Secondary | ICD-10-CM

## 2024-05-19 DIAGNOSIS — R748 Abnormal levels of other serum enzymes: Secondary | ICD-10-CM

## 2024-05-19 DIAGNOSIS — M62838 Other muscle spasm: Secondary | ICD-10-CM

## 2024-05-19 LAB — COMPREHENSIVE METABOLIC PANEL WITH GFR
ALT: 23 IU/L (ref 0–32)
AST: 21 IU/L (ref 0–40)
Albumin: 4.2 g/dL (ref 3.8–4.9)
Alkaline Phosphatase: 93 IU/L (ref 49–135)
BUN/Creatinine Ratio: 24 — ABNORMAL HIGH (ref 9–23)
BUN: 30 mg/dL — ABNORMAL HIGH (ref 6–24)
Bilirubin Total: 0.2 mg/dL (ref 0.0–1.2)
CO2: 21 mmol/L (ref 20–29)
Calcium: 9.4 mg/dL (ref 8.7–10.2)
Chloride: 103 mmol/L (ref 96–106)
Creatinine, Ser: 1.24 mg/dL — ABNORMAL HIGH (ref 0.57–1.00)
Globulin, Total: 2 g/dL (ref 1.5–4.5)
Glucose: 96 mg/dL (ref 70–99)
Potassium: 4.7 mmol/L (ref 3.5–5.2)
Sodium: 140 mmol/L (ref 134–144)
Total Protein: 6.2 g/dL (ref 6.0–8.5)
eGFR: 52 mL/min/1.73 — ABNORMAL LOW

## 2024-05-19 LAB — LIPID PANEL
Chol/HDL Ratio: 4.5 ratio — ABNORMAL HIGH (ref 0.0–4.4)
Cholesterol, Total: 175 mg/dL (ref 100–199)
HDL: 39 mg/dL — ABNORMAL LOW
LDL Chol Calc (NIH): 101 mg/dL — ABNORMAL HIGH (ref 0–99)
Triglycerides: 201 mg/dL — ABNORMAL HIGH (ref 0–149)
VLDL Cholesterol Cal: 35 mg/dL (ref 5–40)

## 2024-05-19 LAB — LIPOPROTEIN A (LPA): Lipoprotein (a): <8.4 nmol/L

## 2024-05-19 LAB — AMYLASE: Amylase: 100 U/L (ref 31–110)

## 2024-05-19 LAB — LIPASE: Lipase: 99 U/L — ABNORMAL HIGH (ref 14–72)

## 2024-05-19 NOTE — Telephone Encounter (Signed)
 The patient has been notified of the result and verbalized understanding.  All questions (if any) were answered.  Pt states she wants to be referred to [facility] to see Dr. Mona to discuss other alternative therapies to treat her lipids, due to statin intolerance.  Pt is aware we will place the referral in the system and have our [facility] Scheduler reach out to her soon to arrange this appt.   Pt verbalized understanding and agrees with this plan.

## 2024-05-19 NOTE — Telephone Encounter (Signed)
-----   Message from Reche Finder, NP sent at 05/19/2024  8:23 AM EST ----- Stable kidney function. Normal electrolytes and liver. Lipase elevated with normal amylase, recommend follow up with primary care regarding elevated lipase levels. Cholesterol not at goal. Lp(a) with  no evidence of familial hyperlipidemia which is reassuring.   Recommend increasing Rosuvastatin  to 10mg  once per day with FLP/LFT in 2 months.  If hesitant regarding increasing Rosuvastatin , recommend referral to Dr. Mona in [facility].

## 2024-05-19 NOTE — Therapy (Signed)
 " OUTPATIENT PHYSICAL THERAPY CERVICAL TREATMENT   Patient Name: Diane Morales MRN: [id number] DOB:[birth year], 53 y.o., female Today's Date: 05/19/2024  END OF SESSION:  PT End of Session - 05/19/24 1416     Visit Number 5    Number of Visits 16    Date for Recertification  06/20/24    Authorization Type BCBS    PT Start Time 1405    PT Stop Time 1500    PT Time Calculation (min) 55 min    Activity Tolerance Patient tolerated treatment well    Behavior During Therapy [facility] for tasks assessed/performed            Past Medical History:  Diagnosis Date   HTN (hypertension)    Past Surgical History:  Procedure Laterality Date   CESAREAN SECTION  2006   CHOLECYSTECTOMY  05/16/2017   Patient Active Problem List   Diagnosis Date Noted   Other long term (current) drug therapy 04/16/2022   Plantar fasciitis of right foot 03/05/2022   Rotator cuff tendonitis, right 03/05/2022   Umbilical hernia 03/05/2022   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   Laryngopharyngeal reflux (LPR) 03/03/2019   Neck pain 03/03/2019   Referred otalgia of right ear 03/03/2019   Shortness of breath 03/03/2019   Essential hypertension 12/09/2018   GERD (gastroesophageal reflux disease) 12/09/2018   Hyperlipidemia 12/09/2018   Chronic constipation 01/28/2018   Overweight (BMI 25.0-29.9) 01/28/2018   S/P laparoscopic cholecystectomy 05/16/2017   Celiac disease 08/08/2011   Depression 08/08/2011   Hypothyroid 06/27/2011    PCP: Robbi Brinks PA  REFERRING PROVIDER: Robbi Brinks PA  REFERRING DIAG: Cervical spine pain   THERAPY DIAG:  Cervicalgia  Other muscle spasm  Rationale for Evaluation and Treatment: Rehabilitation  ONSET DATE: Approximately 4 months prior  SUBJECTIVE:                                                                                                                                                                                                          SUBJECTIVE STATEMENT: Pt denies any adverse effects after prior treatment.  Pt states she felt better the following day.  Pt not sure if any more benefit with the cupping compared to other manual techniques.  Pt states the more she does, the more sore she is.  Her neck is tense.     Hand dominance: Right  PERTINENT HISTORY:  Plantar fasciitis of right foot, right rotator cuff tendinitis, acid reflux, HTN  PAIN:  Are you having pain? Yes: NPRS scale: 3/10 curently Pain  location: L sided cervical  Pain description: aching  Aggravating factors: as the day goes on  Relieving factors: rest   PRECAUTIONS: None  RED FLAGS: None     WEIGHT BEARING RESTRICTIONS: No  FALLS:  Has patient fallen in last 6 months? 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  DIAGNOSTIC FINDINGS:  Nothing taken   PATIENT SURVEYS:  NDI:  NECK DISABILITY INDEX  Date: 11/29 Score  Pain intensity   2. Personal care (washing, dressing, etc.)   3. Lifting   4. Reading   5. Headaches   6. Concentration   7. Work   8. Driving   9. Sleeping   10. Recreation   Total 10/50   Minimum Detectable Change (90% confidence): 5 points or 10% points  COGNITION: Overall cognitive status: Within functional limits for tasks assessed  SENSATION: WFL  POSTURE: rounded shoulders Mild   PALPATION:    CERVICAL ROM:   Active ROM A/PROM (deg) eval  Flexion 43  Extension 27   Right lateral flexion   Left lateral flexion   Right rotation 82  Left rotation 84   (Blank rows = not tested)  UPPER EXTREMITY ROM:  Active ROM Right eval Left eval  Shoulder flexion WNL WNL  Shoulder extension    Shoulder  abduction    Shoulder adduction    Shoulder extension    Shoulder internal rotation WNL WNL  Shoulder external rotation WNL WNL  Elbow flexion    Elbow extension    Wrist flexion    Wrist extension    Wrist ulnar deviation    Wrist radial deviation    Wrist pronation    Wrist supination     (Blank rows = not tested)  UPPER EXTREMITY MMT:  MMT Right eval Left eval  Shoulder flexion 5 5  Shoulder extension    Shoulder abduction    Shoulder adduction    Shoulder extension    Shoulder internal rotation 5 5  Shoulder external rotation 5 5  Middle trapezius    Lower trapezius    Elbow flexion    Elbow extension    Wrist flexion    Wrist extension    Wrist ulnar deviation    Wrist radial deviation    Wrist pronation    Wrist supination    Grip strength     (Blank rows = not tested)   TREATMENT DATE:                                                                                                                               12/23 UBE x 3 mins (1.5 min each fwd/bwd) Supine manual UT stretch  3x20-30 sec bilat Row cable 20 lbs 2x12  Standing cable extensions  20# 2x12  Manual:  STM to bilat cervical paraspinals and suboccipitals, suboccipital release in supine  Manual cervical distraction in supine  STM and TPR to bilat UT seated   12/18 Manual:  Static cupping to bilat UT seated STM to bilat cervical paraspinals and suboccipitals, suboccipital release in supine  Manual cervical distraction in supine   Row cable 20 lbs 3x12  Standing cable extensions  15# 3x12 Horizontal Abduction GTB 2x12    12/11 Manual:  STM to bilat cervical paraspinals and suboccipitals, suboccipital release in supine  Manual cervical distraction in supine  STM and TPR to bilat UT seated  Neuro-re-ed:  Reviewed current function, HEP compliance, pain level, and response to prior treatment. Bilateral ER x10 RTB, GTB 2x10 Horizontal Abduction RTB x10, GTB 2x10   12/5 Manual:   Trigger point release to upper trap and cervical spine  Manual traction  Suboccipital release   Neuro-re-ed:  Bilateral ER x10 red RPE 4  2x10 RPE of 5   Horizontal Abduction red x10 RPE of 4 moved to greex 2x10 RPE of 5   Row cable 20 lbs 3x12    Eval Manual:  Trigger point release to upper trap and cervical spine  Manual traction  Suboccipital release   There-ex:  Upper trap stretch 2 x 20 seconds each side Levator stretch 2 x 20 seconds each side Scap retraction green 2x 10 cueing Shoulder extension green 2 x 10  PATIENT EDUCATION:  Education details: HEP, symptom management, exercise form Person educated: Patient Education method: Explanation, Demonstration, Tactile cues, Verbal cues, and Handouts Education comprehension: verbalized understanding, returned demonstration, verbal cues required, tactile cues required, and needs further education  HOME EXERCISE PROGRAM: Access Code: [email or web address] URL: [email or web address] Date: 04/25/2024 Prepared by: Alm Don  Exercises - Theracane Over Shoulder  - 1 x daily - 7 x weekly - 3 sets - 10 reps - Shoulder extension with resistance - Neutral  - 1 x daily - 7 x weekly - 3 sets - 10 reps - Scapular Retraction with Resistance  - 1 x daily - 7 x weekly - 3 sets - 10 reps - Gentle Levator Scapulae Stretch  - 1 x daily - 7 x weekly - 3 sets - 10 reps - Seated Upper Trapezius Stretch  - 1 x daily - 7 x weekly - 3 sets - 10 reps  ASSESSMENT:  CLINICAL IMPRESSION: Pt has soft tissue tightness in cervical paraspinals and UT greater on L than R.  Pt had a trigger point in L UT.  PT focused on improving soft tissue tightness and mobility, UT flexibility, and postural/scapular stability and strength.  She tolerated exercises and manual techniques well.  Pt responded well to treatment reporting improved pain from 3/10 before treatment to 0/10 after treatment.  She also reports much improved tension after treatment.  Pt  should benefit from cont skilled PT to address impairments and goals and to assist in restoring PLOF.          OBJECTIVE IMPAIRMENTS: decreased activity tolerance, increased fascial restrictions, impaired UE functional use, and pain.   ACTIVITY LIMITATIONS: carrying, lifting, and reach over head  PARTICIPATION LIMITATIONS: meal prep, cleaning, community activity, and occupation  PERSONAL FACTORS: None   REHAB POTENTIAL: Good  CLINICAL DECISION MAKING: Stable/uncomplicated  EVALUATION COMPLEXITY: Low   GOALS: Goals reviewed with patient? Yes  SHORT TERM GOALS: Target date:  05/23/2024    Patient will  report a 50% reduction in tenderness to palpation bilateral  Baseline:  Goal status: INITIAL  2.  Patient will increase cervical extension to 30 degrees  Baseline:  Goal status: INITIAL  3.  Patient will have a work place assessment performed  Baseline:  Goal status: INITIAL  LONG TERM GOALS: Target date: 06/20/2024    Patient will sleep through the night without pain  Baseline:  Goal status: INITIAL  2.  Patient will perform work tasks without pain  Baseline:  Goal status: INITIAL  3.  Patient will be independent with gym program for posterior chain strengthening  Baseline:  Goal status: INITIAL    PLAN:  PT FREQUENCY: 1-2x/week  PT DURATION: 8 weeks  PLANNED INTERVENTIONS: 97164- PT Re-evaluation, 97110-Therapeutic exercises, 97530- Therapeutic activity, 97112- Neuromuscular re-education, 97535- Self Care, 02859- Manual therapy, G0283- Electrical stimulation (unattended), Patient/Family education, Joint mobilization, Spinal mobilization, Cryotherapy, and Moist heat  PLAN FOR NEXT SESSION:  Consider trigger point dry needling to bilateral upper traps and cervical paraspinals.  Consider further soft tissue mobilization and manual therapy to cervical spine.  Review posterior chain strengthening in the gym.  Use RPE to grade exercises.  Assess response to  cupping.   Leigh Minerva III PT, DPT 05/19/2024 3:21 PM         "

## 2024-06-03 ENCOUNTER — Ambulatory Visit (HOSPITAL_BASED_OUTPATIENT_CLINIC_OR_DEPARTMENT_OTHER): Attending: Physician Assistant | Admitting: Physical Therapy

## 2024-06-03 ENCOUNTER — Encounter (HOSPITAL_BASED_OUTPATIENT_CLINIC_OR_DEPARTMENT_OTHER): Payer: Self-pay | Admitting: Physical Therapy

## 2024-06-03 DIAGNOSIS — M542 Cervicalgia: Secondary | ICD-10-CM | POA: Insufficient documentation

## 2024-06-03 DIAGNOSIS — M62838 Other muscle spasm: Secondary | ICD-10-CM | POA: Insufficient documentation

## 2024-06-03 NOTE — Therapy (Signed)
 " OUTPATIENT PHYSICAL THERAPY CERVICAL TREATMENT   Patient Name: Diane Morales MRN: [id number] DOB:02-Mar-1971, 54 y.o., female Today's Date: 05/19/2024  END OF SESSION:  PT End of Session - 05/19/24 1416     Visit Number 5    Number of Visits 16    Date for Recertification  06/20/24    Authorization Type BCBS    PT Start Time 1405    PT Stop Time 1500    PT Time Calculation (min) 55 min    Activity Tolerance Patient tolerated treatment well    Behavior During Therapy [facility] for tasks assessed/performed            Past Medical History:  Diagnosis Date   HTN (hypertension)    Past Surgical History:  Procedure Laterality Date   CESAREAN SECTION  2006   CHOLECYSTECTOMY  05/16/2017   Patient Active Problem List   Diagnosis Date Noted   Other long term (current) drug therapy 04/16/2022   Plantar fasciitis of right foot 03/05/2022   Rotator cuff tendonitis, right 03/05/2022   Umbilical hernia 03/05/2022   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   Laryngopharyngeal reflux (LPR) 03/03/2019   Neck pain 03/03/2019   Referred otalgia of right ear 03/03/2019   Shortness of breath 03/03/2019   Essential hypertension 12/09/2018   GERD (gastroesophageal reflux disease) 12/09/2018   Hyperlipidemia 12/09/2018   Chronic constipation 01/28/2018   Overweight (BMI 25.0-29.9) 01/28/2018   S/P laparoscopic cholecystectomy 05/16/2017   Celiac disease 08/08/2011   Depression 08/08/2011   Hypothyroid 06/27/2011    PCP: Robbi Brinks PA  REFERRING PROVIDER: Robbi Brinks PA  REFERRING DIAG: Cervical spine pain   THERAPY DIAG:  Cervicalgia  Other muscle spasm  Rationale for Evaluation and Treatment: Rehabilitation  ONSET DATE: Approximately 4 months prior  SUBJECTIVE:                                                                                                                                                                                                          SUBJECTIVE STATEMENT: The patient feels like her pillow is helping. She is just having a little pain up into her neck. Se is otherwise doing well. .   Pt denies any adverse effects after prior treatment.  Pt states she felt better the following day.  Pt not sure if any more benefit with the cupping compared to other manual techniques.  Pt states the more she does, the more sore she is.  Her neck is tense.     Hand dominance: Right  PERTINENT  HISTORY:  Plantar fasciitis of right foot, right rotator cuff tendinitis, acid reflux, HTN  PAIN:  Are you having pain? Yes: NPRS scale: 3/10 curently Pain location: L sided cervical  Pain description: aching  Aggravating factors: as the day goes on  Relieving factors: rest   PRECAUTIONS: None  RED FLAGS: None     WEIGHT BEARING RESTRICTIONS: No  FALLS:  Has patient fallen in last 6 months? 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  DIAGNOSTIC FINDINGS:  Nothing taken   PATIENT SURVEYS:  NDI:  NECK DISABILITY INDEX  Date: 11/29 Score  Pain intensity   2. Personal care (washing, dressing, etc.)   3. Lifting   4. Reading   5. Headaches   6. Concentration   7. Work   8. Driving   9. Sleeping   10. Recreation   Total 10/50   Minimum Detectable Change (90% confidence): 5 points or 10% points  COGNITION: Overall cognitive status: Within functional limits for tasks assessed  SENSATION: WFL  POSTURE: rounded shoulders Mild   PALPATION:    CERVICAL ROM:   Active ROM A/PROM (deg) eval  Flexion 43  Extension 27   Right lateral flexion   Left lateral flexion   Right rotation 82  Left rotation 84   (Blank rows = not  tested)  UPPER EXTREMITY ROM:  Active ROM Right eval Left eval  Shoulder flexion WNL WNL  Shoulder extension    Shoulder abduction    Shoulder adduction    Shoulder extension    Shoulder internal rotation WNL WNL  Shoulder external rotation WNL WNL  Elbow flexion    Elbow extension    Wrist flexion    Wrist extension    Wrist ulnar deviation    Wrist radial deviation    Wrist pronation    Wrist supination     (Blank rows = not tested)  UPPER EXTREMITY MMT:  MMT Right eval Left eval  Shoulder flexion 5 5  Shoulder extension    Shoulder abduction    Shoulder adduction    Shoulder extension    Shoulder internal rotation 5 5  Shoulder external rotation 5 5  Middle trapezius    Lower trapezius    Elbow flexion    Elbow extension    Wrist flexion    Wrist extension    Wrist ulnar deviation    Wrist radial deviation    Wrist pronation    Wrist supination    Grip strength     (Blank rows = not tested)   TREATMENT DATE:  1/7 UBE x 2 min fwd 2 min  Bilateral ER green 3x10  Horizontal abduction 3x10 green  Rwo 3x12 black  Manual:  STM to bilat cervical paraspinals and suboccipitals, suboccipital release in supine  Manual cervical distraction in supine  STM and TPR to bilat UT seated  12/23 UBE x 3 mins (1.5 min each fwd/bwd) Supine manual UT stretch 3x20-30 sec bilat Row cable 20 lbs 2x12  Standing cable extensions  20# 2x12  Manual:  STM to bilat cervical paraspinals and suboccipitals, suboccipital release in supine  Manual cervical distraction in supine  STM and TPR to bilat UT seated   12/18 Manual:  Static cupping to bilat UT seated STM to bilat cervical paraspinals and suboccipitals, suboccipital release in supine  Manual cervical distraction in supine   Row cable 20 lbs 3x12  Standing cable extensions  15#  3x12 Horizontal Abduction GTB 2x12    12/11 Manual:  STM to bilat cervical paraspinals and suboccipitals, suboccipital release in supine  Manual cervical distraction in supine  STM and TPR to bilat UT seated  Neuro-re-ed:  Reviewed current function, HEP compliance, pain level, and response to prior treatment. Bilateral ER x10 RTB, GTB 2x10 Horizontal Abduction RTB x10, GTB 2x10   12/5 Manual:  Trigger point release to upper trap and cervical spine  Manual traction  Suboccipital release   Neuro-re-ed:  Bilateral ER x10 red RPE 4  2x10 RPE of 5   Horizontal Abduction red x10 RPE of 4 moved to greex 2x10 RPE of 5   Row cable 20 lbs 3x12    Eval Manual:  Trigger point release to upper trap and cervical spine  Manual traction  Suboccipital release   There-ex:  Upper trap stretch 2 x 20 seconds each side Levator stretch 2 x 20 seconds each side Scap retraction green 2x 10 cueing Shoulder extension green 2 x 10  PATIENT EDUCATION:  Education details: HEP, symptom management, exercise form Person educated: Patient Education method: Explanation, Demonstration, Tactile cues, Verbal cues, and Handouts Education comprehension: verbalized understanding, returned demonstration, verbal cues required, tactile cues required, and needs further education  HOME EXERCISE PROGRAM: Access Code: [email or web address] URL: [email or web address] Date: 04/25/2024 Prepared by: Alm Don  Exercises - Theracane Over Shoulder  - 1 x daily - 7 x weekly - 3 sets - 10 reps - Shoulder extension with resistance - Neutral  - 1 x daily - 7 x weekly - 3 sets - 10 reps - Scapular Retraction with Resistance  - 1 x daily - 7 x weekly - 3 sets - 10 reps - Gentle Levator Scapulae Stretch  - 1 x daily - 7 x weekly - 3 sets - 10 reps - Seated Upper Trapezius Stretch  - 1 x daily - 7 x weekly - 3 sets - 10 reps  ASSESSMENT:  CLINICAL IMPRESSION: The patient tolerated treatment well. She has a  restriction around c3-c4 on the left. She continues to have spasming in her upper trap. She has responded well to manual therapy. We advanced her bands to green for her seated exercises and back for her rows. Therapy will continue to progress as tolerated.         OBJECTIVE IMPAIRMENTS: decreased activity tolerance, increased fascial restrictions, impaired UE functional use, and pain.   ACTIVITY LIMITATIONS: carrying, lifting, and reach over head  PARTICIPATION LIMITATIONS: meal prep, cleaning, community activity, and occupation  PERSONAL FACTORS: None   REHAB POTENTIAL: Good  CLINICAL DECISION MAKING: Stable/uncomplicated  EVALUATION COMPLEXITY: Low  GOALS: Goals reviewed with patient? Yes  SHORT TERM GOALS: Target date:  05/23/2024    Patient will report a 50% reduction in tenderness to palpation bilateral  Baseline:  Goal status: INITIAL  2.  Patient will increase cervical extension to 30 degrees  Baseline:  Goal status: INITIAL  3.  Patient will have a work place assessment performed  Baseline:  Goal status: INITIAL  LONG TERM GOALS: Target date: 06/20/2024    Patient will sleep through the night without pain  Baseline:  Goal status: INITIAL  2.  Patient will perform work tasks without pain  Baseline:  Goal status: INITIAL  3.  Patient will be independent with gym program for posterior chain strengthening  Baseline:  Goal status: INITIAL    PLAN:  PT FREQUENCY: 1-2x/week  PT DURATION: 8 weeks  PLANNED INTERVENTIONS: 97164- PT Re-evaluation, 97110-Therapeutic exercises, 97530- Therapeutic activity, 97112- Neuromuscular re-education, 97535- Self Care, 02859- Manual therapy, G0283- Electrical stimulation (unattended), Patient/Family education, Joint mobilization, Spinal mobilization, Cryotherapy, and Moist heat  PLAN FOR NEXT SESSION:  Consider trigger point dry needling to bilateral upper traps and cervical paraspinals.  Consider further soft  tissue mobilization and manual therapy to cervical spine.  Review posterior chain strengthening in the gym.  Use RPE to grade exercises.  Assess response to cupping.   Leigh Minerva III PT, DPT 05/19/2024 3:21 PM         "

## 2024-06-04 ENCOUNTER — Encounter (HOSPITAL_BASED_OUTPATIENT_CLINIC_OR_DEPARTMENT_OTHER): Payer: Self-pay | Admitting: Internal Medicine

## 2024-06-04 ENCOUNTER — Other Ambulatory Visit (HOSPITAL_BASED_OUTPATIENT_CLINIC_OR_DEPARTMENT_OTHER): Payer: Self-pay

## 2024-06-04 ENCOUNTER — Other Ambulatory Visit (HOSPITAL_COMMUNITY): Payer: Self-pay

## 2024-06-04 ENCOUNTER — Telehealth: Payer: Self-pay | Admitting: Pharmacy Technician

## 2024-06-04 ENCOUNTER — Ambulatory Visit (INDEPENDENT_AMBULATORY_CARE_PROVIDER_SITE_OTHER): Admitting: Internal Medicine

## 2024-06-04 VITALS — BP 134/82 | HR 76 | Ht 74.0 in | Wt 231.1 lb

## 2024-06-04 DIAGNOSIS — K853 Drug induced acute pancreatitis without necrosis or infection: Secondary | ICD-10-CM | POA: Diagnosis not present

## 2024-06-04 DIAGNOSIS — E785 Hyperlipidemia, unspecified: Secondary | ICD-10-CM

## 2024-06-04 DIAGNOSIS — Z8249 Family history of ischemic heart disease and other diseases of the circulatory system: Secondary | ICD-10-CM | POA: Diagnosis not present

## 2024-06-04 DIAGNOSIS — I2510 Atherosclerotic heart disease of native coronary artery without angina pectoris: Secondary | ICD-10-CM | POA: Diagnosis not present

## 2024-06-04 MED ORDER — REPATHA SURECLICK 140 MG/ML ~~LOC~~ SOAJ
140.0000 mg | SUBCUTANEOUS | 3 refills | Status: AC
  Filled 2024-06-04: qty 6, 84d supply, fill #0
  Filled 2024-06-16: qty 2, 28d supply, fill #0

## 2024-06-04 NOTE — Telephone Encounter (Signed)
 Pharmacy Patient Advocate Encounter   Received notification from Physician's Office that prior authorization for repatha  is required/requested.   Insurance verification completed.   The patient is insured through [employer].   Per test claim: PA required; PA submitted to above mentioned insurance via Latent Key/confirmation #[id number] Status is pending

## 2024-06-04 NOTE — Progress Notes (Signed)
 "   LIPID CLINIC CONSULT NOTE  Chief Complaint:  Manage dyslipidemia  Primary Care Physician: Shepperson, Kirstin, PA-C  Primary Cardiologist:  None  HPI:  Diane Morales is a 54 y.o. female who is being seen today for the evaluation of dyslipidemia at the request of Vannie Reche RAMAN, NP.  This is a pleasant 54 year old female kindly referred for evaluation management of dyslipidemia.  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  She initially had a coronary calcium  score ordered by Dr. Hobart in 2022.  This showed an elevated calcium  score of 6.71 however this was 90th percentile for age and sex matched controls and primarily the calcium  was located in her LAD artery.  Based on that she should have an aggressive LDL target of less than 70.  She had previously been on statin therapy but had an episode of abdominal pain and was found to have an elevated lipase.  Subsequently she was recently retrialed on lower dose statin therapy and again developed an elevated lipase suggesting that this may be statin related pancreatitis.  Imaging in September 2024 for right sided abdominal pain that lasted for several months indicated no evidence of pancreatitis, however.  PMHx:  Past Medical History:  Diagnosis Date   Chronic kidney disease    HTN (hypertension)    Hyperlipidemia    Thyroid  disease     Past Surgical History:  Procedure Laterality Date   CESAREAN SECTION  2006   CHOLECYSTECTOMY  05/16/2017    FAMHx:  Family History  Problem Relation Age of Onset   Breast cancer Mother 82   Heart disease Father        fatal 16 yo   Breast cancer Maternal Grandmother 55    SOCHx:   reports that she has never smoked. She has never used smokeless tobacco. She reports current alcohol use. She reports that she does not use drugs.  ALLERGIES:  Allergies[1]  ROS: Pertinent items noted in HPI and remainder of comprehensive ROS otherwise  negative.  HOME MEDS: Medications Ordered Prior to Encounter[2]  LABS/IMAGING: No results found for this or any previous visit (from the past 48 hours). No results found.  LIPID PANEL:    Component Value Date/Time   CHOL 175 05/15/2024 0806   TRIG 201 (H) 05/15/2024 0806   HDL 39 (L) 05/15/2024 0806   CHOLHDL 4.5 (H) 05/15/2024 0806   LDLCALC 101 (H) 05/15/2024 0806    Lipoprotein (a)  Date/Time Value Ref Range Status  05/15/2024 08:06 AM <8.4 <75.0 nmol/L Final    Comment:    **Results verified by repeat testing** Note:  Values greater than or equal to 75.0 nmol/L may        indicate an independent risk factor for CHD,        but must be evaluated with caution when applied        to non-Caucasian populations due to the        influence of genetic factors on Lp(a) across        ethnicities.      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   Ms. 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  Based on this I would consider her intolerant of statins and we would consider looking for additional lipid-lowering therapies.  Recent LDL was just over 100 however she still was on some statin therapy therefore I suspect her cholesterol will go higher.  She needs at least 50% reduction in LDL cholesterol to get to target less than 70.  I think she is a good candidate for PCSK9 inhibitor as an alternative and will reach out for authorization for Repatha .  This is due to the fact that she has's significant age advanced coronary calcium .  Plan repeat lipids including NMR and LP(a) in about 3  months on therapy.  Should she develop any recurrent abdominal pain I would consider repeat pancreatic enzymes, however Repatha  is relatively unlikely to cause pancreatitis.  Vinie KYM Maxcy, MD, [facility], FNLA, FACP  [place]  [facility]  Medical Director of the Advanced Lipid Disorders &  Cardiovascular Risk Reduction Clinic Diplomate of the American Board of Clinical Lipidology Attending Cardiologist  Direct Dial: [phone]  Fax: [phone]  Website:  [email or web address]  Vinie BROCKS Angelito Hopping 06/04/2024, 4:48 PM     [1]  Allergies Allergen Reactions   Gluten Meal     Other reaction(s): Other (See Comments) Celiac Celiac    Statins Other (See Comments)    pancreatitis   Sulfacetamide Sodium-Sulfur     Other Reaction(s): Unknown  [2]  Current Outpatient Medications on File Prior to Visit  Medication Sig Dispense Refill   allopurinol  (ZYLOPRIM ) 100 MG tablet Take 0.5 tablets (50 mg total) by mouth daily. 45 tablet 3   buPROPion  (WELLBUTRIN  XL) 150 MG 24 hr tablet Take 150 mg by mouth every morning.     Calcium  Carbonate-Vit D-Min (CALCIUM  1200 PO) Take by mouth daily.     Cholecalciferol (D3 PO) Take by mouth daily.     cyclobenzaprine  (FLEXERIL ) 10 MG tablet Take 1 tablet (10 mg total) by mouth at bedtime as needed for muscle spasms. 30 tablet 0   empagliflozin  (JARDIANCE ) 10 MG TABS tablet Take 1 tablet (10 mg total) by mouth daily. 90 tablet 3   famciclovir  (FAMVIR ) 500 MG tablet Take three tablets (1,500 mg dose) by mouth daily for 1 day. 30 tablet 0   ketorolac (TORADOL) 10 MG tablet Take 10 mg by mouth every 6 (six) hours as needed.     levothyroxine  (SYNTHROID ) 137 MCG tablet Take 1 tablet (137 mcg total) by mouth every morning at 6 am 90 tablet 1   linaclotide (LINZESS) 145 MCG CAPS capsule Take 145 mcg by mouth as needed.     MAGNESIUM PO Take by mouth daily.     mupirocin  ointment (BACTROBAN ) 2 % Apply to affected area(s) 3 times daily 22 g 0   olmesartan   (BENICAR ) 20 MG tablet Take 20 mg by mouth daily.     pantoprazole  (PROTONIX ) 40 MG tablet Take 1 tablet by mouth daily.     traMADol  (ULTRAM ) 50 MG tablet Take 1 tablet (50 mg total) by mouth every 8 (eight) hours as needed for breakthrough pain. 21 tablet 0   buPROPion  (WELLBUTRIN  XL) 150 MG 24 hr tablet Take 1 tablet (150 mg total) by mouth in the morning. (Patient not taking: Reported on 06/04/2024) 90 tablet 1   cyclobenzaprine  (FLEXERIL )  10 MG tablet Take 10 mg by mouth at bedtime. (Patient not taking: Reported on 06/04/2024)     methylPREDNISolone  (MEDROL  DOSEPAK) 4 MG TBPK tablet Take as directed on package directions for 6 days. (Patient not taking: Reported on 06/04/2024) 21 tablet 0   olmesartan  (BENICAR ) 20 MG tablet Take 1 tablet (20 mg total) by mouth daily. (Patient not taking: Reported on 06/04/2024) 90 tablet 1   pantoprazole  (PROTONIX ) 40 MG tablet Take 1 tablet (40 mg total) by mouth daily. (Patient not taking: Reported on 06/04/2024) 90 tablet 1   No current facility-administered medications on file prior to visit.   "

## 2024-06-04 NOTE — Patient Instructions (Signed)
 Medication Instructions:  Dr. Mona recommends Repatha  (PCSK9). This is an injectable cholesterol medication self-administered once every 14 days. This medication will likely need prior approval with your insurance company, which we will work on. If the medication is not approved initially, we may need to do an appeal with your insurance. If approved, we will provide you with copay and cost information. We'll then send the prescription to your pharmacy. We would have you complete another set of fasting labs between 3-4 months to reassess cholesterol.   Repatha  is self-injected once every 14 days in subcutaneous or fatty tissue - such as belly or side/outer/upper thigh. It is best stored in the refrigerator but is stable at room temp up to 28 days. Please take the pen-injector out of fridge about 30 minutes - 1 hour prior to injection, to allow it to warm closer to room temperature.  Repatha , a monoclonal antibody therapy, lowers LDL by an average of 55-63%. It can also lower LP(a). It has also been studied and demonstrates cardiovascular risk reduction in persons with and without a prior cardiac history (such as heart attack or stroke). Here is more info: https://www.repatha .com/what-is-repatha   PCSK9 (a protein) binds to LDL receptors in the liver which results in breakdown of the LDL receptor. This prevents the liver from clearing out LDL (bad cholesterol). Repatha  is a PCSK9 inhibitor, meaning it blocks this pathway.   Most common side effects:  Injection site reaction (similar findings in study group with medication and placebo group without medication) Runny nose, sore throat or symptoms of common cold which are often self-limiting and improve after subsequent injections as your body gets accustomed to the medication.    Here is a demo video: https://www.repatha .com/how-to-start-repatha -injection   If you need a co-pay card for Repatha : https://www.repatha .com/repatha -cost  Patient  Assistance:    These foundations have funds at various times.   The [employer] Foundation: https://www.panfoundation.org/disease-funds/hypercholesterolemia/ -- can sign up for wait list  The [facility] offers assistance to help pay for medication copays.  They will cover copays for all cholesterol lowering meds, including statins, fibrates, omega-3 fish oils like Vascepa, ezetimibe, Repatha , Praluent, Nexletol, Nexlizet.  The cards are usually good for $2,500 or 12 months, whichever comes first. Our fax # is [phone] (you will need this to apply) Go to healthwellfoundation.org Click on Apply Now Answer questions as to whom is applying (patient or representative) Your disease fund will be hypercholesterolemia - Medicare access They will ask questions about finances and which medications you are taking for cholesterol When you submit, the approval is usually within minutes.  You will need to print the card information from the site You will need to show this information to your pharmacy, they will bill your Medicare Part D plan first -then bill [employer] --for the copay.   You can also call them at [phone], although the hold times can be quite long.     *If you need a refill on your cardiac medications before your next appointment, please call your pharmacy*  Lab Work: Your physician recommends that you return for lab work in: 3 months   NMR Lipoprofile and Lp(a)  Testing/Procedures: none  Follow-Up: At [facility], you and your health needs are our priority.  As part of our continuing mission to provide you with exceptional heart care, our providers are all part of one team.  This team includes your primary Cardiologist (physician) and Advanced Practice Providers or APPs (Physician Assistants and Nurse Practitioners) who all work together  to provide you with the care you need, when you need it.  Your next appointment:   3-4 month(s)  Provider:   Reche Finder, NP

## 2024-06-05 ENCOUNTER — Other Ambulatory Visit (HOSPITAL_BASED_OUTPATIENT_CLINIC_OR_DEPARTMENT_OTHER): Payer: Self-pay

## 2024-06-09 ENCOUNTER — Other Ambulatory Visit (HOSPITAL_COMMUNITY): Payer: Self-pay

## 2024-06-10 ENCOUNTER — Ambulatory Visit (HOSPITAL_BASED_OUTPATIENT_CLINIC_OR_DEPARTMENT_OTHER): Admitting: Physical Therapy

## 2024-06-10 ENCOUNTER — Other Ambulatory Visit (HOSPITAL_BASED_OUTPATIENT_CLINIC_OR_DEPARTMENT_OTHER): Payer: Self-pay

## 2024-06-10 DIAGNOSIS — M542 Cervicalgia: Secondary | ICD-10-CM | POA: Diagnosis not present

## 2024-06-10 DIAGNOSIS — M62838 Other muscle spasm: Secondary | ICD-10-CM

## 2024-06-10 NOTE — Therapy (Signed)
 " OUTPATIENT PHYSICAL THERAPY CERVICAL TREATMENT   Patient Name: Diane Morales MRN: [id number] DOB:[birth year], 54 y.o., female Today's Date: 05/19/2024  END OF SESSION:  PT End of Session - 06/11/23      Visit Number 6   Number of Visits 16    Date for Recertification  06/20/24    Authorization Type BCBS    PT Start Time 1628    PT Stop Time 1713    PT Time Calculation (min) 45 min    Activity Tolerance Patient tolerated treatment well    Behavior During Therapy [facility] for tasks assessed/performed            Past Medical History:  Diagnosis Date   HTN (hypertension)    Past Surgical History:  Procedure Laterality Date   CESAREAN SECTION  2006   CHOLECYSTECTOMY  05/16/2017   Patient Active Problem List   Diagnosis Date Noted   Other long term (current) drug therapy 04/16/2022   Plantar fasciitis of right foot 03/05/2022   Rotator cuff tendonitis, right 03/05/2022   Umbilical hernia 03/05/2022   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   Laryngopharyngeal reflux (LPR) 03/03/2019   Neck pain 03/03/2019   Referred otalgia of right ear 03/03/2019   Shortness of breath 03/03/2019   Essential hypertension 12/09/2018   GERD (gastroesophageal reflux disease) 12/09/2018   Hyperlipidemia 12/09/2018   Chronic constipation 01/28/2018   Overweight (BMI 25.0-29.9) 01/28/2018   S/P laparoscopic cholecystectomy 05/16/2017   Celiac disease 08/08/2011   Depression 08/08/2011   Hypothyroid 06/27/2011    PCP: Robbi Brinks PA  REFERRING PROVIDER: Robbi Brinks PA  REFERRING DIAG: Cervical spine pain   THERAPY DIAG:  Cervicalgia  Other muscle spasm  Rationale for Evaluation and Treatment: Rehabilitation  ONSET DATE: Approximately 4 months prior  SUBJECTIVE:                                                                                                                                                                                                          SUBJECTIVE STATEMENT: Pt states she is doing good today.  Pt denies any adverse effects after prior treatment.  Pt feels more of a limited ROM than neck pain.  Her pain is more in UT than up her neck.   Pt denies any adverse effects after prior treatment.  Pt states she felt better the following day.  Pt not sure if any more benefit with the cupping compared to other manual techniques.  Pt states the more she does, the more sore she is.  Her  neck is tense.     Hand dominance: Right  PERTINENT HISTORY:  Plantar fasciitis of right foot, right rotator cuff tendinitis, acid reflux, HTN  PAIN:  Are you having pain? Yes: NPRS scale: 0-1/10 curently Pain location: L sided cervical  Pain description: aching  Aggravating factors: as the day goes on  Relieving factors: rest   PRECAUTIONS: None  RED FLAGS: None     WEIGHT BEARING RESTRICTIONS: No  FALLS:  Has patient fallen in last 6 months? 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  DIAGNOSTIC FINDINGS:  Nothing taken   PATIENT SURVEYS:  NDI:  NECK DISABILITY INDEX  Date: 11/29 Score  Pain intensity   2. Personal care (washing, dressing, etc.)   3. Lifting   4. Reading   5. Headaches   6. Concentration   7. Work   8. Driving   9. Sleeping   10. Recreation   Total 10/50   Minimum Detectable Change (90% confidence): 5 points or 10% points  COGNITION: Overall cognitive status: Within functional limits for tasks assessed  SENSATION: WFL  POSTURE: rounded shoulders Mild   PALPATION:    CERVICAL ROM:   Active ROM A/PROM (deg) eval  Flexion 43  Extension 27   Right lateral flexion   Left lateral flexion   Right rotation  82  Left rotation 84   (Blank rows = not tested)  UPPER EXTREMITY ROM:  Active ROM Right eval Left eval  Shoulder flexion WNL WNL  Shoulder extension    Shoulder abduction    Shoulder adduction    Shoulder extension    Shoulder internal rotation WNL WNL  Shoulder external rotation WNL WNL  Elbow flexion    Elbow extension    Wrist flexion    Wrist extension    Wrist ulnar deviation    Wrist radial deviation    Wrist pronation    Wrist supination     (Blank rows = not tested)  UPPER EXTREMITY MMT:  MMT Right eval Left eval  Shoulder flexion 5 5  Shoulder extension    Shoulder abduction    Shoulder adduction    Shoulder extension    Shoulder internal rotation 5 5  Shoulder external rotation 5 5  Middle trapezius    Lower trapezius    Elbow flexion    Elbow extension    Wrist flexion    Wrist extension    Wrist ulnar deviation    Wrist radial deviation    Wrist pronation    Wrist supination    Grip strength     (Blank rows = not tested)   TREATMENT DATE:  1/14 UBE x 4 min (2 min each fwd and bwd)  Bilat ER with GTB 3x12 Standing Horizontal abd with GTB 3x12 Standing rows with Black T-band 3x12  4D ball rolls on wall x10 each cw, ccw, up/down, s/s  Manual:  STM to bilat cervical paraspinals in supine  Manual cervical distraction in supine  STM and TPR to bilat UT seated  1/7 UBE x 2 min fwd 2 min  Bilateral ER green 3x10  Horizontal abduction 3x10 green  Rwo 3x12 black  Manual:  STM to bilat cervical paraspinals and suboccipitals, suboccipital release in supine  Manual cervical distraction in supine  STM and TPR to bilat UT seated  12/23 UBE x 3 mins (1.5 min each fwd/bwd) Supine manual UT stretch 3x20-30 sec bilat Row cable 20 lbs 2x12  Standing cable extensions  20# 2x12  Manual:  STM to bilat cervical paraspinals  and suboccipitals, suboccipital release in supine  Manual cervical distraction in supine  STM and TPR to bilat UT seated   12/18 Manual:  Static cupping to bilat UT seated STM to bilat cervical paraspinals and suboccipitals, suboccipital release in supine  Manual cervical distraction in supine   Row cable 20 lbs 3x12  Standing cable extensions  15# 3x12 Horizontal Abduction GTB 2x12    12/11 Manual:  STM to bilat cervical paraspinals and suboccipitals, suboccipital release in supine  Manual cervical distraction in supine  STM and TPR to bilat UT seated  Neuro-re-ed:  Reviewed current function, HEP compliance, pain level, and response to prior treatment. Bilateral ER x10 RTB, GTB 2x10 Horizontal Abduction RTB x10, GTB 2x10   12/5 Manual:  Trigger point release to upper trap and cervical spine  Manual traction  Suboccipital release   Neuro-re-ed:  Bilateral ER x10 red RPE 4  2x10 RPE of 5   Horizontal Abduction red x10 RPE of 4 moved to greex 2x10 RPE of 5   Row cable 20 lbs 3x12    Eval Manual:  Trigger point release to upper trap and cervical spine  Manual traction  Suboccipital release   There-ex:  Upper trap stretch 2 x 20 seconds each side Levator stretch 2 x 20 seconds each side Scap retraction green 2x 10 cueing Shoulder extension green 2 x 10  PATIENT EDUCATION:  Education details: HEP, symptom management, exercise form Person educated: Patient Education method: Explanation, Demonstration, Tactile cues, Verbal cues, and Handouts Education comprehension: verbalized understanding, returned demonstration, verbal cues required, tactile cues required, and needs further education  HOME EXERCISE PROGRAM: Access Code: [email or web address] URL: [email or web address] Date: 04/25/2024 Prepared by: Alm Don  Exercises - Theracane Over Shoulder  - 1 x daily - 7 x weekly - 3 sets - 10 reps - Shoulder extension with resistance - Neutral  - 1 x daily -  7 x weekly - 3 sets - 10 reps - Scapular Retraction with Resistance  - 1 x daily - 7 x weekly - 3 sets - 10 reps - Gentle Levator Scapulae Stretch  - 1 x daily - 7 x weekly - 3 sets - 10 reps - Seated Upper Trapezius Stretch  - 1 x daily - 7 x weekly - 3 sets - 10 reps  ASSESSMENT:  CLINICAL IMPRESSION: The patient tolerated treatment well. She has a restriction around c3-c4 on the left. She continues to have spasming in her upper trap. She has responded well to manual therapy. We advanced her bands to green for her seated exercises and back for her rows. Therapy  will continue to progress as tolerated.         OBJECTIVE IMPAIRMENTS: decreased activity tolerance, increased fascial restrictions, impaired UE functional use, and pain.   ACTIVITY LIMITATIONS: carrying, lifting, and reach over head  PARTICIPATION LIMITATIONS: meal prep, cleaning, community activity, and occupation  PERSONAL FACTORS: None   REHAB POTENTIAL: Good  CLINICAL DECISION MAKING: Stable/uncomplicated  EVALUATION COMPLEXITY: Low   GOALS: Goals reviewed with patient? Yes  SHORT TERM GOALS: Target date:  05/23/2024    Patient will report a 50% reduction in tenderness to palpation bilateral  Baseline:  Goal status: INITIAL  2.  Patient will increase cervical extension to 30 degrees  Baseline:  Goal status: INITIAL  3.  Patient will have a work place assessment performed  Baseline:  Goal status: INITIAL  LONG TERM GOALS: Target date: 06/20/2024    Patient will sleep through the night without pain  Baseline:  Goal status: INITIAL  2.  Patient will perform work tasks without pain  Baseline:  Goal status: INITIAL  3.  Patient will be independent with gym program for posterior chain strengthening  Baseline:  Goal status: INITIAL    PLAN:  PT FREQUENCY: 1-2x/week  PT DURATION: 8 weeks  PLANNED INTERVENTIONS: 97164- PT Re-evaluation, 97110-Therapeutic exercises, 97530- Therapeutic  activity, 97112- Neuromuscular re-education, 97535- Self Care, 02859- Manual therapy, [id number]- Electrical stimulation (unattended), Patient/Family education, Joint mobilization, Spinal mobilization, Cryotherapy, and Moist heat  PLAN FOR NEXT SESSION:  Consider trigger point dry needling to bilateral upper traps and cervical paraspinals.  Consider further soft tissue mobilization and manual therapy to cervical spine.  Review posterior chain strengthening in the gym.  Use RPE to grade exercises.  Assess response to cupping.   Leigh Minerva III PT, DPT 05/19/2024 3:21 PM         "

## 2024-06-11 ENCOUNTER — Encounter (HOSPITAL_BASED_OUTPATIENT_CLINIC_OR_DEPARTMENT_OTHER): Payer: Self-pay | Admitting: Physical Therapy

## 2024-06-12 ENCOUNTER — Encounter (HOSPITAL_BASED_OUTPATIENT_CLINIC_OR_DEPARTMENT_OTHER): Admitting: Physical Therapy

## 2024-06-15 ENCOUNTER — Other Ambulatory Visit (HOSPITAL_COMMUNITY): Payer: Self-pay

## 2024-06-15 ENCOUNTER — Other Ambulatory Visit (HOSPITAL_BASED_OUTPATIENT_CLINIC_OR_DEPARTMENT_OTHER): Payer: Self-pay

## 2024-06-15 NOTE — Telephone Encounter (Signed)
 Pharmacy Patient Advocate Encounter  Received notification from [facility] that Prior Authorization for Repatha  has been APPROVED from 06/15/24 to 06/15/25. Ran test claim, Copay is $90.08- one month. This test claim was processed through [facility]- copay amounts may vary at other pharmacies due to pharmacy/plan contracts, or as the patient moves through the different stages of their insurance plan.   PA #/Case ID/Reference #: [id number]

## 2024-06-16 ENCOUNTER — Other Ambulatory Visit (HOSPITAL_BASED_OUTPATIENT_CLINIC_OR_DEPARTMENT_OTHER): Payer: Self-pay

## 2024-06-17 ENCOUNTER — Ambulatory Visit (HOSPITAL_BASED_OUTPATIENT_CLINIC_OR_DEPARTMENT_OTHER): Payer: Self-pay | Admitting: Physical Therapy

## 2024-06-18 ENCOUNTER — Encounter (HOSPITAL_BASED_OUTPATIENT_CLINIC_OR_DEPARTMENT_OTHER): Admitting: Physical Therapy

## 2024-06-22 ENCOUNTER — Encounter (HOSPITAL_BASED_OUTPATIENT_CLINIC_OR_DEPARTMENT_OTHER): Admitting: Physical Therapy

## 2024-06-23 ENCOUNTER — Ambulatory Visit (HOSPITAL_BASED_OUTPATIENT_CLINIC_OR_DEPARTMENT_OTHER): Payer: Self-pay | Admitting: Physical Therapy

## 2024-06-26 ENCOUNTER — Other Ambulatory Visit (HOSPITAL_BASED_OUTPATIENT_CLINIC_OR_DEPARTMENT_OTHER): Payer: Self-pay

## 2024-06-26 MED ORDER — CHLORHEXIDINE GLUCONATE 0.12 % MT SOLN
OROMUCOSAL | 1 refills | Status: AC
  Filled 2024-06-26: qty 473, 14d supply, fill #0

## 2024-07-08 ENCOUNTER — Institutional Professional Consult (permissible substitution) (HOSPITAL_BASED_OUTPATIENT_CLINIC_OR_DEPARTMENT_OTHER): Admitting: Internal Medicine

## 2024-09-29 ENCOUNTER — Ambulatory Visit (HOSPITAL_BASED_OUTPATIENT_CLINIC_OR_DEPARTMENT_OTHER): Admitting: Family

## 2024-10-02 ENCOUNTER — Ambulatory Visit (HOSPITAL_BASED_OUTPATIENT_CLINIC_OR_DEPARTMENT_OTHER): Admitting: Family
